# Patient Record
Sex: Female | Born: 1935 | Race: Black or African American | Hispanic: No | Smoking: Never smoker
Health system: Southern US, Community
[De-identification: ages and names within clinical notes are randomized; demographics above are authoritative.]

## PROBLEM LIST (undated history)

## (undated) DIAGNOSIS — S2239XA Fracture of one rib, unspecified side, initial encounter for closed fracture: Secondary | ICD-10-CM

## (undated) DIAGNOSIS — I441 Atrioventricular block, second degree: Secondary | ICD-10-CM

## (undated) DIAGNOSIS — I509 Heart failure, unspecified: Secondary | ICD-10-CM

## (undated) DIAGNOSIS — E7800 Pure hypercholesterolemia, unspecified: Secondary | ICD-10-CM

## (undated) DIAGNOSIS — M1990 Unspecified osteoarthritis, unspecified site: Secondary | ICD-10-CM

## (undated) DIAGNOSIS — S8290XD Unspecified fracture of unspecified lower leg, subsequent encounter for closed fracture with routine healing: Secondary | ICD-10-CM

## (undated) DIAGNOSIS — M4800 Spinal stenosis, site unspecified: Secondary | ICD-10-CM

## (undated) HISTORY — PX: HERNIA REPAIR: SHX51

## (undated) HISTORY — PX: ORTHOPEDIC SURGERY: SHX850

---

## 2009-06-14 DIAGNOSIS — F0280 Dementia in other diseases classified elsewhere without behavioral disturbance: Secondary | ICD-10-CM | POA: Insufficient documentation

## 2009-06-14 DIAGNOSIS — M810 Age-related osteoporosis without current pathological fracture: Secondary | ICD-10-CM | POA: Insufficient documentation

## 2009-09-20 DIAGNOSIS — M259 Joint disorder, unspecified: Secondary | ICD-10-CM | POA: Insufficient documentation

## 2009-09-20 DIAGNOSIS — H6120 Impacted cerumen, unspecified ear: Secondary | ICD-10-CM | POA: Insufficient documentation

## 2009-11-16 DIAGNOSIS — R634 Abnormal weight loss: Secondary | ICD-10-CM | POA: Insufficient documentation

## 2009-12-11 ENCOUNTER — Encounter: Admission: RE | Admit: 2009-12-11 | Discharge: 2009-12-11 | Payer: Self-pay | Admitting: Internal Medicine

## 2009-12-27 DIAGNOSIS — E875 Hyperkalemia: Secondary | ICD-10-CM | POA: Insufficient documentation

## 2010-02-20 ENCOUNTER — Encounter: Admission: RE | Admit: 2010-02-20 | Discharge: 2010-02-20 | Payer: Self-pay | Admitting: Internal Medicine

## 2010-05-08 DIAGNOSIS — M542 Cervicalgia: Secondary | ICD-10-CM | POA: Insufficient documentation

## 2010-05-10 ENCOUNTER — Inpatient Hospital Stay (HOSPITAL_COMMUNITY): Admission: EM | Admit: 2010-05-10 | Discharge: 2010-05-12 | Payer: Self-pay | Admitting: Emergency Medicine

## 2010-05-16 ENCOUNTER — Emergency Department (HOSPITAL_COMMUNITY): Admission: EM | Admit: 2010-05-16 | Discharge: 2010-05-16 | Payer: Self-pay | Admitting: Emergency Medicine

## 2010-05-28 ENCOUNTER — Emergency Department (HOSPITAL_COMMUNITY): Admission: EM | Admit: 2010-05-28 | Discharge: 2010-05-28 | Payer: Self-pay | Admitting: Emergency Medicine

## 2010-07-21 DIAGNOSIS — R269 Unspecified abnormalities of gait and mobility: Secondary | ICD-10-CM | POA: Insufficient documentation

## 2011-01-19 ENCOUNTER — Encounter: Payer: Self-pay | Admitting: Internal Medicine

## 2011-01-27 DIAGNOSIS — D649 Anemia, unspecified: Secondary | ICD-10-CM | POA: Insufficient documentation

## 2011-03-03 DIAGNOSIS — M5000 Cervical disc disorder with myelopathy, unspecified cervical region: Secondary | ICD-10-CM | POA: Insufficient documentation

## 2011-03-10 DIAGNOSIS — G44209 Tension-type headache, unspecified, not intractable: Secondary | ICD-10-CM | POA: Insufficient documentation

## 2011-03-12 ENCOUNTER — Emergency Department (HOSPITAL_COMMUNITY): Payer: Medicare Other

## 2011-03-12 ENCOUNTER — Emergency Department (HOSPITAL_COMMUNITY)
Admission: EM | Admit: 2011-03-12 | Discharge: 2011-03-12 | Disposition: A | Discharge status: Home / Self Care | Payer: Medicare Other | Attending: Emergency Medicine | Admitting: Emergency Medicine

## 2011-03-12 DIAGNOSIS — R51 Headache: Secondary | ICD-10-CM | POA: Insufficient documentation

## 2011-03-12 DIAGNOSIS — F0390 Unspecified dementia without behavioral disturbance: Secondary | ICD-10-CM | POA: Insufficient documentation

## 2011-03-12 DIAGNOSIS — I6789 Other cerebrovascular disease: Secondary | ICD-10-CM | POA: Insufficient documentation

## 2011-03-12 DIAGNOSIS — Z8679 Personal history of other diseases of the circulatory system: Secondary | ICD-10-CM | POA: Insufficient documentation

## 2011-03-12 DIAGNOSIS — G319 Degenerative disease of nervous system, unspecified: Secondary | ICD-10-CM | POA: Insufficient documentation

## 2011-03-12 DIAGNOSIS — E785 Hyperlipidemia, unspecified: Secondary | ICD-10-CM | POA: Insufficient documentation

## 2011-03-12 DIAGNOSIS — M542 Cervicalgia: Secondary | ICD-10-CM | POA: Insufficient documentation

## 2011-03-12 DIAGNOSIS — K219 Gastro-esophageal reflux disease without esophagitis: Secondary | ICD-10-CM | POA: Insufficient documentation

## 2011-03-12 DIAGNOSIS — M47812 Spondylosis without myelopathy or radiculopathy, cervical region: Secondary | ICD-10-CM | POA: Insufficient documentation

## 2011-03-17 LAB — URINALYSIS, ROUTINE W REFLEX MICROSCOPIC: Specific Gravity, Urine: 1.022 (ref 1.005–1.030)

## 2011-03-17 LAB — BASIC METABOLIC PANEL
BUN: 18 mg/dL (ref 6–23)
CO2: 25 mEq/L (ref 19–32)
Chloride: 107 mEq/L (ref 96–112)
GFR calc Af Amer: >60 mL/min (ref >60)
GFR calc non Af Amer: 50 mL/min — ABNORMAL LOW (ref >60)
Sodium: 141 mEq/L (ref 135–145)

## 2011-03-17 LAB — CBC
HCT: 36.1 % (ref 36.0–46.0)
Hemoglobin: 11.6 g/dL — ABNORMAL LOW (ref 12.0–15.0)
MCV: 88.3 fL (ref 78.0–100.0)
Platelets: 219 10*3/uL (ref 150–400)
WBC: 6.5 10*3/uL (ref 4.0–10.5)

## 2011-03-18 LAB — CBC
HCT: 35.1 % — ABNORMAL LOW (ref 36.0–46.0)
HCT: 36.3 % (ref 36.0–46.0)
Hemoglobin: 11.8 g/dL — ABNORMAL LOW (ref 12.0–15.0)
Hemoglobin: 12.2 g/dL (ref 12.0–15.0)
MCHC: 33.6 g/dL (ref 30.0–36.0)
MCV: 86.7 fL (ref 78.0–100.0)
MCV: 87 fL (ref 78.0–100.0)
RBC: 4.17 MIL/uL (ref 3.87–5.11)
RDW: 13.6 % (ref 11.5–15.5)
WBC: 4.4 10*3/uL (ref 4.0–10.5)

## 2011-03-18 LAB — CARDIAC PANEL(CRET KIN+CKTOT+MB+TROPI)
CK, MB: 1 ng/mL (ref 0.3–4.0)
CK, MB: 1.2 ng/mL (ref 0.3–4.0)
Relative Index: INVALID (ref 0.0–2.5)
Total CK: 85 U/L (ref 7–177)
Troponin I: <0.01 ng/mL (ref 0.00–0.06)

## 2011-03-18 LAB — COMPREHENSIVE METABOLIC PANEL
AST: 28 U/L (ref 0–37)
BUN: 17 mg/dL (ref 6–23)
BUN: 20 mg/dL (ref 6–23)
CO2: 28 mEq/L (ref 19–32)
Calcium: 8.7 mg/dL (ref 8.4–10.5)
Chloride: 110 mEq/L (ref 96–112)
Creatinine, Ser: 1.02 mg/dL (ref 0.4–1.2)
Creatinine, Ser: 1.02 mg/dL (ref 0.4–1.2)
GFR calc non Af Amer: 52 mL/min — ABNORMAL LOW (ref >60)
GFR calc non Af Amer: 52 mL/min — ABNORMAL LOW (ref >60)
Glucose, Bld: 94 mg/dL (ref 70–99)
Total Bilirubin: 0.4 mg/dL (ref 0.3–1.2)
Total Protein: 6.3 g/dL (ref 6.0–8.3)

## 2011-03-18 LAB — URINALYSIS, ROUTINE W REFLEX MICROSCOPIC
Hgb urine dipstick: NEGATIVE
Ketones, ur: NEGATIVE mg/dL
Nitrite: NEGATIVE
Protein, ur: NEGATIVE mg/dL
Specific Gravity, Urine: 1.022 (ref 1.005–1.030)
Urobilinogen, UA: 1 mg/dL (ref 0.0–1.0)
pH: 8.5 — ABNORMAL HIGH (ref 5.0–8.0)

## 2011-03-18 LAB — DIFFERENTIAL
Basophils Absolute: 0 10*3/uL (ref 0.0–0.1)
Eosinophils Relative: 2 % (ref 0–5)
Lymphocytes Relative: 35 % (ref 12–46)
Neutro Abs: 2.4 10*3/uL (ref 1.7–7.7)

## 2011-03-18 LAB — TROPONIN I: Troponin I: <0.01 ng/mL (ref 0.00–0.06)

## 2011-03-18 LAB — LIPID PANEL
LDL Cholesterol: 56 mg/dL (ref 0–99)
Triglycerides: 54 mg/dL (ref <150)

## 2011-03-18 LAB — POCT CARDIAC MARKERS: Troponin i, poc: <0.05 ng/mL (ref 0.00–0.09)

## 2011-03-18 LAB — HEMOGLOBIN A1C
Hgb A1c MFr Bld: 6.5 % — ABNORMAL HIGH (ref <5.7)
Mean Plasma Glucose: 140 mg/dL — ABNORMAL HIGH (ref <117)

## 2011-05-01 DIAGNOSIS — M6281 Muscle weakness (generalized): Secondary | ICD-10-CM

## 2011-05-01 DIAGNOSIS — R4182 Altered mental status, unspecified: Secondary | ICD-10-CM

## 2011-05-01 DIAGNOSIS — M1990 Unspecified osteoarthritis, unspecified site: Secondary | ICD-10-CM

## 2011-05-01 DIAGNOSIS — M5000 Cervical disc disorder with myelopathy, unspecified cervical region: Secondary | ICD-10-CM

## 2011-05-01 DIAGNOSIS — H543 Unqualified visual loss, both eyes: Secondary | ICD-10-CM

## 2011-05-01 DIAGNOSIS — M25519 Pain in unspecified shoulder: Secondary | ICD-10-CM

## 2011-05-01 DIAGNOSIS — M542 Cervicalgia: Secondary | ICD-10-CM

## 2011-05-01 DIAGNOSIS — R059 Cough, unspecified: Secondary | ICD-10-CM

## 2011-05-01 DIAGNOSIS — R0789 Other chest pain: Secondary | ICD-10-CM

## 2011-05-01 DIAGNOSIS — E785 Hyperlipidemia, unspecified: Secondary | ICD-10-CM

## 2011-05-01 DIAGNOSIS — R269 Unspecified abnormalities of gait and mobility: Secondary | ICD-10-CM

## 2011-05-01 DIAGNOSIS — R490 Dysphonia: Secondary | ICD-10-CM

## 2011-05-01 DIAGNOSIS — G8929 Other chronic pain: Secondary | ICD-10-CM

## 2011-05-01 DIAGNOSIS — H6120 Impacted cerumen, unspecified ear: Secondary | ICD-10-CM

## 2011-05-01 DIAGNOSIS — D649 Anemia, unspecified: Secondary | ICD-10-CM

## 2011-05-01 DIAGNOSIS — F0280 Dementia in other diseases classified elsewhere without behavioral disturbance: Secondary | ICD-10-CM

## 2011-05-01 DIAGNOSIS — R634 Abnormal weight loss: Secondary | ICD-10-CM

## 2011-05-01 DIAGNOSIS — E875 Hyperkalemia: Secondary | ICD-10-CM

## 2011-05-01 DIAGNOSIS — R05 Cough: Secondary | ICD-10-CM

## 2011-05-01 DIAGNOSIS — G4489 Other headache syndrome: Secondary | ICD-10-CM

## 2011-05-01 DIAGNOSIS — R609 Edema, unspecified: Secondary | ICD-10-CM

## 2011-05-01 DIAGNOSIS — M79609 Pain in unspecified limb: Secondary | ICD-10-CM

## 2011-05-01 DIAGNOSIS — M259 Joint disorder, unspecified: Secondary | ICD-10-CM

## 2011-05-01 DIAGNOSIS — M4800 Spinal stenosis, site unspecified: Secondary | ICD-10-CM

## 2011-05-01 DIAGNOSIS — R42 Dizziness and giddiness: Secondary | ICD-10-CM

## 2011-11-17 ENCOUNTER — Other Ambulatory Visit (HOSPITAL_BASED_OUTPATIENT_CLINIC_OR_DEPARTMENT_OTHER): Payer: Self-pay | Admitting: Internal Medicine

## 2011-11-17 DIAGNOSIS — Z780 Asymptomatic menopausal state: Secondary | ICD-10-CM

## 2011-11-17 DIAGNOSIS — Z1231 Encounter for screening mammogram for malignant neoplasm of breast: Secondary | ICD-10-CM

## 2011-12-16 ENCOUNTER — Ambulatory Visit: Payer: Medicare Other

## 2011-12-16 ENCOUNTER — Other Ambulatory Visit: Payer: Medicare Other

## 2012-02-04 ENCOUNTER — Encounter (HOSPITAL_COMMUNITY): Payer: Self-pay | Admitting: Emergency Medicine

## 2012-02-04 ENCOUNTER — Emergency Department (INDEPENDENT_AMBULATORY_CARE_PROVIDER_SITE_OTHER): Payer: Medicare Other

## 2012-02-04 ENCOUNTER — Emergency Department (INDEPENDENT_AMBULATORY_CARE_PROVIDER_SITE_OTHER)
Admission: EM | Admit: 2012-02-04 | Discharge: 2012-02-04 | Disposition: A | Discharge status: Home / Self Care | Payer: Medicare Other | Source: Home / Self Care | Attending: Emergency Medicine | Admitting: Emergency Medicine

## 2012-02-04 DIAGNOSIS — T148XXA Other injury of unspecified body region, initial encounter: Secondary | ICD-10-CM

## 2012-02-04 NOTE — ED Notes (Addendum)
Per daughter, pt fell Friday, and landed on bottom. Pain started a day or 2 later; reportedly had similar fall a few yr ago, and bruised tailbone . Patient c/o pain w walking or bending, points to perineal. Pelvic outlet area

## 2012-02-04 NOTE — ED Provider Notes (Signed)
Chief Complaint  Patient presents with  . Fall    History of Present Illness:  The patient is an 78 year old female. 6 days ago she was in the kitchen and wasn't using her walker or cane and she slipped or tripped and fell. She sat down, landing on her buttocks. Ever since then she's had pain over the ischial tuberosities and the cream and coccyx. There's been no bruising, swelling, or deformity. It's been getting slightly worse and hurts when she walks. She denies any numbness, tingling, or weakness in the lower extremities. No bladder or bowel complaints.  Review of Systems:  Other than noted above, the patient denies any of the following symptoms: Systemic:  No fever, chills, fatigue, or weight loss. GI:  No abdominal pain, nausea, vomiting, diarrhea, constipation or blood in stool. GU:  No dysuria, frequency, urgency, or hematuria. No incontinence or difficulty urinating.  M-S:  No neck pain, joint pain, arthritis, or myalgias. Neuro:  No parethesias or muscular weakness. Skin:  No rash or itching.   PMFSH:  Past medical history, family history, social history, meds, and allergies were reviewed.  Physical Exam:   Vital signs:  BP 128/61  Pulse 75  Temp(Src) 97.7 F (36.5 C) (Oral)  Resp 20  SpO2 95% General:  Alert, oriented, in no distress. Abdomen:  Soft, non-tender.  No organomegaly or mass.  No pulsatile midline abdominal mass or bruit. Back:  There was tenderness to palpation over the sacrum, coccyx, and ischial tuberosities. There was no swelling, bruising, or deformity. Neuro:  Normal muscle strength, sensations and DTRs. Skin:  Clear, warm and dry.  No rash.   Radiology:  Dg Hip Bilateral W/pelvis  02/04/2012  *RADIOLOGY REPORT*  Clinical Data: Larey Seat several nights ago with pain in the pelvis and both hips  BILATERAL HIP WITH PELVIS - 4+ VIEW  Comparison: None.  Findings: There are mild-to-moderate degenerative changes involving both hips left slightly greater than right.   No acute fracture is seen.  There is some irregularity of both inferior pubic and ischial rami most consistent with old fractures.  No acute fracture is seen.  The SI joints are unremarkable.  IMPRESSION: No acute fracture.  Old fractures of the inferior pubic rami.  Original Report Authenticated By: Juline Patch, M.D.    Medications given in U[facility]:  None  Assessment:   Diagnoses that have been ruled out:  None  Diagnoses that are still under consideration:  None  Final diagnoses:  Contusion    Plan:   1.  The following meds were prescribed:   New Prescriptions   No medications on file   She was told to use the tramadol or Percocet which she has at home, rest, use her walker when she's on her feet, use a soft pillow or donut for sitting, and apply ice. 2.  The patient was instructed in symptomatic care and handouts were given. 3.  The patient was told to return if becoming worse in any way, if no better in 3 or 4 days, and given some red flag symptoms that would indicate earlier return.    Roque Lias, MD 02/04/12 1340

## 2012-02-07 ENCOUNTER — Emergency Department (HOSPITAL_COMMUNITY)
Admission: EM | Admit: 2012-02-07 | Discharge: 2012-02-07 | Disposition: A | Discharge status: Home / Self Care | Payer: Medicare Other | Attending: Emergency Medicine | Admitting: Emergency Medicine

## 2012-02-07 ENCOUNTER — Encounter (HOSPITAL_COMMUNITY): Payer: Self-pay | Admitting: Emergency Medicine

## 2012-02-07 ENCOUNTER — Emergency Department (HOSPITAL_COMMUNITY): Payer: Medicare Other

## 2012-02-07 DIAGNOSIS — M545 Low back pain, unspecified: Secondary | ICD-10-CM | POA: Insufficient documentation

## 2012-02-07 DIAGNOSIS — E7800 Pure hypercholesterolemia, unspecified: Secondary | ICD-10-CM | POA: Insufficient documentation

## 2012-02-07 DIAGNOSIS — M4800 Spinal stenosis, site unspecified: Secondary | ICD-10-CM

## 2012-02-07 DIAGNOSIS — R269 Unspecified abnormalities of gait and mobility: Secondary | ICD-10-CM | POA: Insufficient documentation

## 2012-02-07 DIAGNOSIS — M810 Age-related osteoporosis without current pathological fracture: Secondary | ICD-10-CM | POA: Insufficient documentation

## 2012-02-07 DIAGNOSIS — M1990 Unspecified osteoarthritis, unspecified site: Secondary | ICD-10-CM | POA: Insufficient documentation

## 2012-02-07 DIAGNOSIS — Z79899 Other long term (current) drug therapy: Secondary | ICD-10-CM | POA: Insufficient documentation

## 2012-02-07 DIAGNOSIS — M549 Dorsalgia, unspecified: Secondary | ICD-10-CM

## 2012-02-07 HISTORY — DX: Pure hypercholesterolemia, unspecified: E78.00

## 2012-02-07 HISTORY — DX: Fracture of one rib, unspecified side, initial encounter for closed fracture: S22.39XA

## 2012-02-07 HISTORY — DX: Unspecified fracture of unspecified lower leg, subsequent encounter for closed fracture with routine healing: S82.90XD

## 2012-02-07 HISTORY — DX: Unspecified osteoarthritis, unspecified site: M19.90

## 2012-02-07 LAB — URINALYSIS, ROUTINE W REFLEX MICROSCOPIC
Bilirubin Urine: NEGATIVE
Glucose, UA: NEGATIVE mg/dL
Ketones, ur: NEGATIVE mg/dL
Leukocytes, UA: NEGATIVE
Specific Gravity, Urine: 1.017 (ref 1.005–1.030)
pH: 7.5 (ref 5.0–8.0)

## 2012-02-07 MED ORDER — HYDROMORPHONE HCL PF 1 MG/ML IJ SOLN
0.5000 mg | INTRAMUSCULAR | Status: AC
  Administered 2012-02-07: 0.5 mg via INTRAVENOUS
  Filled 2012-02-07: qty 1

## 2012-02-07 MED ORDER — OXYCODONE-ACETAMINOPHEN 5-325 MG PO TABS: 2.0000 | ORAL_TABLET | ORAL | Status: AC | PRN

## 2012-02-07 MED ORDER — HYDROMORPHONE HCL PF 1 MG/ML IJ SOLN
0.5000 mg | INTRAMUSCULAR | Status: AC
  Administered 2012-02-07: 0.5 mg via INTRAMUSCULAR
  Filled 2012-02-07: qty 1

## 2012-02-07 MED ORDER — METHYLPREDNISOLONE 4 MG PO KIT: 4.0000 mg | PACK | Freq: Once | ORAL | Status: DC

## 2012-02-07 NOTE — ED Notes (Signed)
Report received from Sherri, RN

## 2012-02-07 NOTE — ED Notes (Signed)
Pt fell 8 days ago at home - daughter states she "sat down in kitchen".  Has had increased pain since that time.  Seen at [facility] Wednesday for same and x-ray performed.  Dx with bruising and instructed to take pain med.  Continues to have pain, and now had pain in bilateral legs.

## 2012-02-07 NOTE — ED Notes (Signed)
Pt returned from xray at this time.

## 2012-02-07 NOTE — ED Notes (Signed)
Pt assisted on bedpan.

## 2012-02-07 NOTE — ED Provider Notes (Signed)
  I performed a history and physical examination of Mary Hartman and discussed her management with Dr. Milbert Coulter.  I agree with the history, physical, assessment, and plan of care, with the following exceptions: None  I was present for the following procedures: None Time Spent in Critical Care of the patient: None Time spent in discussions with the patient and family: 10 min  The patient with continued sacral and coccygeal pain after fall approximately 7 days ago. Was seen in urgent care where hip films were performed. Has been taking Percocet with continued pain. Is able to ambulate although with pain.  5/5 strength in the upper and lower ext bilaterally. She has normal reflexes. There is no loss of bowel or bladder function.  Possible compression deformities in the lumbar spine. MRI will be performed given the radiation to her legs bilaterally. This is unremarkable she can be discharged home with continued pain medication instructions followup with her primary care physician. She has been sitting on a donut pillow which has helped to extent.  Tildon Husky, MD 02/07/12 1314

## 2012-02-07 NOTE — ED Notes (Signed)
Regular meal tray ordered from service response center.

## 2012-02-07 NOTE — ED Notes (Signed)
Pt not back from MRI yet 

## 2012-02-07 NOTE — ED Notes (Signed)
Pt report given and care endorsed to Marylu Lund, [place].  Pt sent to MRI and will return to CDU 7

## 2012-02-07 NOTE — ED Notes (Signed)
Patient transported to X-ray 

## 2012-02-07 NOTE — ED Provider Notes (Signed)
History     CSN: [id number]  Arrival date & time 02/07/12  3086   First MD Initiated Contact with Patient 02/07/12 [phone]      Chief Complaint  Patient presents with  . Hip Pain  . Leg Pain   HPI Presents with b/l tail bone pain, pt is s/p mechanical fall on Friday (01/31/12) and she presented to urgent care.  Hip/pelvis XR at that time suggested no acute fracture.  Since Friday, pain has worsened, to the point that ambulation is limited.  No new trauma.   ROM limited by pain.  Pt able to perform transfers.  Denies numbness/tingling/dizziness & bowel/urinary incontinence.  Pain radiates to b/l legs.  She [place] unable to qualify pain, but describes as 10/10.  Pain medication (percocet 5-325 q4h) relieves pain somewhat.  History of bruising tail bone in past (about 7 years ago). History of rib fractures years ago.   Past Medical History  Diagnosis Date  . Osteoporosis   . Osteoarthritis   . Hypercholesteremia   . Orthopedic aftercare for healing traumatic leg fracture   . Rib fracture     Past Surgical History  Procedure Date  . Hernia repair   . Orthopedic surgery     No family history on file.   History  Substance Use Topics  . Smoking status: Never Smoker   . Smokeless tobacco: Not on file  . Alcohol Use: No    Review of Systems  Constitutional: Positive for activity change. Negative for fever, chills, diaphoresis and appetite change.  HENT: Negative for neck pain and neck stiffness.   Eyes: Negative for visual disturbance.  Respiratory: Negative for cough, choking, chest tightness, shortness of breath and wheezing.   Cardiovascular: Negative for chest pain, palpitations and leg swelling.  Gastrointestinal: Negative for nausea, vomiting, abdominal pain, diarrhea, constipation and abdominal distention.  Genitourinary: Negative for dysuria, urgency, frequency and difficulty urinating.  Musculoskeletal: Positive for arthralgias and gait problem. Negative for myalgias and joint  swelling.  Skin: Negative for rash and wound.  Neurological: Negative for dizziness, weakness, light-headedness, numbness and headaches.  Psychiatric/Behavioral: Negative for hallucinations, behavioral problems, confusion and agitation.    Allergies  Review of patient's allergies indicates no known allergies.  Home Medications   Current Outpatient Rx  Name Route Sig Dispense Refill  . ALENDRONATE SODIUM 70 MG PO TABS Oral Take 70 mg by mouth every 7 (seven) days. Sundays For osteoporosis. Take with a full glass of water on an empty stomach.    . ASPIRIN 81 MG PO TABS Oral Take 81 mg by mouth daily.      . ATORVASTATIN CALCIUM 20 MG PO TABS Oral Take 20 mg by mouth daily. For cholesterol     . DICLOFENAC SODIUM 1 % TD GEL Topical Apply topically as directed. 4 (four) grams 4 (four) times daily to back as needed for pain.     Marland Kitchen STOOL SOFTENER PO Oral Take by mouth daily as needed. For constipation     . FUROSEMIDE 40 MG PO TABS Oral Take 20 mg by mouth daily.     Marland Kitchen OMEPRAZOLE 20 MG PO CPDR Oral Take 20 mg by mouth daily. For reflux     . OXYCODONE-ACETAMINOPHEN 5-325 MG PO TABS Oral Take 1 tablet by mouth every 4 (four) hours as needed. For severe pain     . POTASSIUM BICARBONATE 25 MEQ PO TBEF Oral Take by mouth. Dissolved in water daily.     . TRAMADOL HCL 50 MG  PO TABS Oral Take 50 mg by mouth every 4 (four) hours as needed. for pain      BP 126/68  Pulse 89  Temp(Src) 98.2 F (36.8 C) (Oral)  Resp 18  Ht 4\' 9"  (1.448 m)  Wt 104 lb (47.174 kg)  BMI 22.51 kg/m2  SpO2 98%  Physical Exam  Vitals reviewed. Constitutional: She is oriented to person, place, and time. She appears well-developed and well-nourished. No distress.  Eyes: Conjunctivae and EOM are normal. Pupils are equal, round, and reactive to light.  Neck: Normal range of motion. Neck supple.  Cardiovascular: Normal rate, regular rhythm and intact distal pulses.   Murmur heard. Pulmonary/Chest: Effort normal and  breath sounds normal. No respiratory distress. She has no wheezes. She has no rales. She exhibits no tenderness.  Abdominal: Soft. Bowel sounds are normal. She exhibits no distension and no mass. There is no tenderness. There is no rebound and no guarding.  Musculoskeletal:       Arthritic changes in b/l hands; TTP of b/l coccyx & thighs; mild 1+ b/l pedal edema (pitting); ROM of b/l hip limited by pain of sitting on coccyx  Neurological: She is alert and oriented to person, place, and time. No cranial nerve deficit.  Skin: Skin is warm and dry. No rash noted. She is not diaphoretic.  Psychiatric: She has a normal mood and affect. Her behavior is normal.    ED Course  Procedures (including critical care time)   Labs Reviewed  URINALYSIS, ROUTINE W REFLEX MICROSCOPIC   Dg Lumbar Spine Complete  02/07/2012  *RADIOLOGY REPORT*  Clinical Data: Hip pain, leg pain post fall 1 week ago  LUMBAR SPINE - COMPLETE 4+ VIEW  Comparison:  lateral view of the chest 05/10/2010  Findings: Study is markedly limited by abundant bowel gas and significant osteopenia.  There is mild lumbar dextroscoliosis. There is moderate compression fracture of the L4 vertebral body of indeterminate age.  Subacute compression fracture cannot be excluded.  Clinical correlation is necessary.  There is about 1 cm anterolisthesis L3 on L4 vertebral body.  Further evaluation with CT scan or MRI is recommended. Significant disc space flattening with endplate sclerotic changes noted L5 S1 level.   IMPRESSION:  1.  Limited study by diffuse osteopenia and abundant bowel gas. There is moderate compression fracture of the L4 vertebral body of indeterminate age.  Subacute compression fracture cannot be excluded.  Clinical correlation is necessary.  There is about 1 cm anterolisthesis L3 on L4 vertebral body.  Further evaluation with CT scan or MRI is recommended.  Original Report Authenticated By: Natasha Mead, M.D.   Dg Sacrum/coccyx  02/07/2012   *RADIOLOGY REPORT*  Clinical Data: Lower back/tail bone pain  SACRUM AND COCCYX - 2+ VIEW  Comparison: None.  Findings: No displaced sacrococcygeal fracture is seen.  Degenerative changes of the lower lumbar spine.  Osteopenia.  IMPRESSION: No displaced sacrococcygeal fracture is seen.  Original Report Authenticated By: Charline Bills, M.D.     No diagnosis found.    MDM  Likely related to bruising of coccyx but in setting of worsening pain and radiation to legs, will order lumbar and sacrum/coccyx XR.  Will also order UA.  Pain mgmt with IM dilaudid.   UA negative.  Will follow up lumbar XR with lumbar MRI.  Pt to be moved to CDU for monitoring.        Vernice Jefferson, MD 02/07/12 1301

## 2012-02-27 ENCOUNTER — Encounter (HOSPITAL_COMMUNITY): Payer: Self-pay | Admitting: Emergency Medicine

## 2012-02-27 ENCOUNTER — Emergency Department (HOSPITAL_COMMUNITY): Payer: Medicare Other

## 2012-02-27 ENCOUNTER — Inpatient Hospital Stay (HOSPITAL_COMMUNITY)
Admission: EM | Admit: 2012-02-27 | Discharge: 2012-03-02 | DRG: 291 | Disposition: A | Discharge status: Skilled Nursing Facility | Payer: Medicare Other | Source: Ambulatory Visit | Attending: Internal Medicine | Admitting: Internal Medicine

## 2012-02-27 ENCOUNTER — Other Ambulatory Visit: Payer: Self-pay

## 2012-02-27 DIAGNOSIS — Z7982 Long term (current) use of aspirin: Secondary | ICD-10-CM

## 2012-02-27 DIAGNOSIS — E7800 Pure hypercholesterolemia, unspecified: Secondary | ICD-10-CM | POA: Diagnosis present

## 2012-02-27 DIAGNOSIS — E43 Unspecified severe protein-calorie malnutrition: Secondary | ICD-10-CM | POA: Diagnosis present

## 2012-02-27 DIAGNOSIS — F0280 Dementia in other diseases classified elsewhere without behavioral disturbance: Secondary | ICD-10-CM | POA: Diagnosis present

## 2012-02-27 DIAGNOSIS — R0602 Shortness of breath: Secondary | ICD-10-CM | POA: Diagnosis present

## 2012-02-27 DIAGNOSIS — G309 Alzheimer's disease, unspecified: Secondary | ICD-10-CM | POA: Diagnosis present

## 2012-02-27 DIAGNOSIS — I509 Heart failure, unspecified: Secondary | ICD-10-CM | POA: Diagnosis present

## 2012-02-27 DIAGNOSIS — Z23 Encounter for immunization: Secondary | ICD-10-CM

## 2012-02-27 DIAGNOSIS — M810 Age-related osteoporosis without current pathological fracture: Secondary | ICD-10-CM | POA: Diagnosis present

## 2012-02-27 DIAGNOSIS — Z79899 Other long term (current) drug therapy: Secondary | ICD-10-CM

## 2012-02-27 DIAGNOSIS — I5033 Acute on chronic diastolic (congestive) heart failure: Principal | ICD-10-CM | POA: Diagnosis present

## 2012-02-27 DIAGNOSIS — D649 Anemia, unspecified: Secondary | ICD-10-CM | POA: Diagnosis present

## 2012-02-27 HISTORY — DX: Spinal stenosis, site unspecified: M48.00

## 2012-02-27 LAB — CBC
HCT: 35 % — ABNORMAL LOW (ref 36.0–46.0)
Hemoglobin: 11.2 g/dL — ABNORMAL LOW (ref 12.0–15.0)
MCV: 86.2 fL (ref 78.0–100.0)
Platelets: 184 10*3/uL (ref 150–400)
RBC: 4.06 MIL/uL (ref 3.87–5.11)
WBC: 10.3 10*3/uL (ref 4.0–10.5)

## 2012-02-27 LAB — COMPREHENSIVE METABOLIC PANEL
ALT: 14 U/L (ref 0–35)
Alkaline Phosphatase: 145 U/L — ABNORMAL HIGH (ref 39–117)
CO2: 22 mEq/L (ref 19–32)
Calcium: 9.1 mg/dL (ref 8.4–10.5)
GFR calc Af Amer: 53 mL/min — ABNORMAL LOW (ref >90)
GFR calc non Af Amer: 46 mL/min — ABNORMAL LOW (ref >90)
Glucose, Bld: 140 mg/dL — ABNORMAL HIGH (ref 70–99)
Sodium: 138 mEq/L (ref 135–145)

## 2012-02-27 LAB — DIFFERENTIAL
Eosinophils Relative: 0 % (ref 0–5)
Lymphocytes Relative: 7 % — ABNORMAL LOW (ref 12–46)
Lymphs Abs: 0.7 10*3/uL (ref 0.7–4.0)
Monocytes Relative: 5 % (ref 3–12)

## 2012-02-27 MED ORDER — FUROSEMIDE 10 MG/ML IJ SOLN
40.0000 mg | Freq: Once | INTRAMUSCULAR | Status: AC
  Administered 2012-02-27: 40 mg via INTRAVENOUS
  Filled 2012-02-27: qty 4

## 2012-02-27 MED ORDER — NITROGLYCERIN 2 % TD OINT
0.5000 [in_us] | TOPICAL_OINTMENT | Freq: Once | TRANSDERMAL | Status: AC
  Administered 2012-02-27: 0.5 [in_us] via TOPICAL
  Filled 2012-02-27: qty 1

## 2012-02-27 NOTE — ED Notes (Signed)
Pt's daughter reports increased weakness, SOB, difficulty with ADLs and disoriented since last night, pt oriented to person and place in triage, O2 placed on pt in triage

## 2012-02-27 NOTE — ED Notes (Signed)
Portable chest x ray completed.

## 2012-02-27 NOTE — ED Notes (Signed)
Labs obtained per lab staff.

## 2012-02-28 ENCOUNTER — Encounter (HOSPITAL_COMMUNITY): Payer: Self-pay | Admitting: Nurse Practitioner

## 2012-02-28 DIAGNOSIS — R0602 Shortness of breath: Secondary | ICD-10-CM | POA: Diagnosis present

## 2012-02-28 DIAGNOSIS — E43 Unspecified severe protein-calorie malnutrition: Secondary | ICD-10-CM | POA: Diagnosis present

## 2012-02-28 DIAGNOSIS — D649 Anemia, unspecified: Secondary | ICD-10-CM | POA: Diagnosis present

## 2012-02-28 DIAGNOSIS — I509 Heart failure, unspecified: Secondary | ICD-10-CM | POA: Diagnosis present

## 2012-02-28 LAB — COMPREHENSIVE METABOLIC PANEL
ALT: 12 U/L (ref 0–35)
BUN: 22 mg/dL (ref 6–23)
CO2: 23 mEq/L (ref 19–32)
Calcium: 8.9 mg/dL (ref 8.4–10.5)
Creatinine, Ser: 1.16 mg/dL — ABNORMAL HIGH (ref 0.50–1.10)
GFR calc Af Amer: 50 mL/min — ABNORMAL LOW (ref >90)
GFR calc non Af Amer: 43 mL/min — ABNORMAL LOW (ref >90)
Glucose, Bld: 84 mg/dL (ref 70–99)
Total Protein: 7 g/dL (ref 6.0–8.3)

## 2012-02-28 LAB — CARDIAC PANEL(CRET KIN+CKTOT+MB+TROPI)
Relative Index: INVALID (ref 0.0–2.5)
Total CK: 80 U/L (ref 7–177)
Troponin I: <0.3 ng/mL (ref <0.30)

## 2012-02-28 MED ORDER — DICLOFENAC SODIUM 1 % TD GEL
1.0000 "application " | Freq: Four times a day (QID) | TRANSDERMAL | Status: DC | PRN
  Administered 2012-02-28 – 2012-03-02 (×2): 1 via TOPICAL
  Filled 2012-02-28: qty 100

## 2012-02-28 MED ORDER — ONDANSETRON HCL 4 MG PO TABS: 4.0000 mg | ORAL_TABLET | Freq: Four times a day (QID) | ORAL | Status: DC | PRN

## 2012-02-28 MED ORDER — DONEPEZIL HCL 5 MG PO TABS
5.0000 mg | ORAL_TABLET | Freq: Every day | ORAL | Status: DC
  Administered 2012-02-28 – 2012-03-02 (×4): 5 mg via ORAL
  Filled 2012-02-28 (×4): qty 1

## 2012-02-28 MED ORDER — SIMVASTATIN 40 MG PO TABS
40.0000 mg | ORAL_TABLET | Freq: Every day | ORAL | Status: DC
  Administered 2012-02-28 – 2012-03-02 (×4): 40 mg via ORAL
  Filled 2012-02-28 (×4): qty 1

## 2012-02-28 MED ORDER — DOCUSATE SODIUM 100 MG PO CAPS
100.0000 mg | ORAL_CAPSULE | Freq: Every day | ORAL | Status: DC
  Administered 2012-02-28 – 2012-03-02 (×4): 100 mg via ORAL
  Filled 2012-02-28 (×4): qty 1

## 2012-02-28 MED ORDER — FUROSEMIDE 10 MG/ML IJ SOLN
40.0000 mg | Freq: Every day | INTRAMUSCULAR | Status: DC
  Administered 2012-02-28 – 2012-02-29 (×2): 40 mg via INTRAVENOUS
  Filled 2012-02-28 (×2): qty 4

## 2012-02-28 MED ORDER — ONDANSETRON HCL 4 MG/2ML IJ SOLN: 4.0000 mg | Freq: Four times a day (QID) | INTRAMUSCULAR | Status: DC | PRN

## 2012-02-28 MED ORDER — ASPIRIN 81 MG PO CHEW
81.0000 mg | CHEWABLE_TABLET | Freq: Every day | ORAL | Status: DC
  Administered 2012-02-28 – 2012-03-02 (×4): 81 mg via ORAL
  Filled 2012-02-28 (×4): qty 1

## 2012-02-28 MED ORDER — PNEUMOCOCCAL VAC POLYVALENT 25 MCG/0.5ML IJ INJ
0.5000 mL | INJECTION | INTRAMUSCULAR | Status: AC
  Administered 2012-03-01: 0.5 mL via INTRAMUSCULAR
  Filled 2012-02-28: qty 0.5

## 2012-02-28 MED ORDER — ACETAMINOPHEN 325 MG PO TABS: 650.0000 mg | ORAL_TABLET | Freq: Four times a day (QID) | ORAL | Status: DC | PRN

## 2012-02-28 MED ORDER — ENSURE CLINICAL ST REVIGOR PO LIQD
237.0000 mL | Freq: Two times a day (BID) | ORAL | Status: DC
  Administered 2012-03-01 (×2): 237 mL via ORAL

## 2012-02-28 MED ORDER — PANTOPRAZOLE SODIUM 40 MG PO TBEC
40.0000 mg | DELAYED_RELEASE_TABLET | Freq: Every day | ORAL | Status: DC
  Administered 2012-02-28 – 2012-03-01 (×3): 40 mg via ORAL
  Filled 2012-02-28 (×2): qty 1

## 2012-02-28 MED ORDER — LIDOCAINE 5 % EX PTCH
1.0000 | MEDICATED_PATCH | CUTANEOUS | Status: DC
  Administered 2012-02-29 – 2012-03-02 (×2): 1 via TRANSDERMAL
  Filled 2012-02-28 (×5): qty 1

## 2012-02-28 MED ORDER — TRAMADOL HCL 50 MG PO TABS
50.0000 mg | ORAL_TABLET | ORAL | Status: DC | PRN
  Administered 2012-02-28 – 2012-03-01 (×5): 50 mg via ORAL
  Filled 2012-02-28 (×5): qty 1

## 2012-02-28 MED ORDER — ACETAMINOPHEN 650 MG RE SUPP: 650.0000 mg | Freq: Four times a day (QID) | RECTAL | Status: DC | PRN

## 2012-02-28 NOTE — Progress Notes (Signed)
Patient's daughter informed that patient is in a camera room.  She agreed that when she left for the night it was a good idea to use for the patient's safety.  Bed alarm also turned on and call bell within reach.

## 2012-02-28 NOTE — Progress Notes (Signed)
Subjective: Still get winded with moving around. Objective: Filed Vitals:   02/27/12 2330 02/28/12 0000 02/28/12 0047 02/28/12 0555  BP: 105/66 107/64 109/68 129/79  Pulse: 80 78 88 76  Temp:   98.1 F (36.7 C) 98.7 F (37.1 C)  TempSrc:   Oral Oral  Resp: 20 21 20    Height:   4\' 9"  (1.448 m)   Weight:   44.6 kg (98 lb 5.2 oz)   SpO2: 94% 96% 90% 93%   Weight change:   Intake/Output Summary (Last 24 hours) at 02/28/12 1306 Last data filed at 02/28/12 1152  Gross per 24 hour  Intake    360 ml  Output   1475 ml  Net  -1115 ml    General: Alert, awake, oriented x1, in no acute distress.  HEENT: No bruits, no goiter. +JVD. Heart: Regular rate and rhythm, without murmurs, rubs, gallops.  Lungs: Crackles bilateral crackles,  bilateral air movement.  Abdomen: Soft, nontender, nondistended, positive bowel sounds.  Neuro: Grossly intact, nonfocal.   Lab Results:  Basename 02/28/12 0600 02/27/12 2110  NA 140 138  K 4.0 4.3  CL 104 103  CO2 23 22  GLUCOSE 84 140*  BUN 22 23  CREATININE 1.16* 1.10  CALCIUM 8.9 9.1  MG -- --  PHOS -- --    Basename 02/28/12 0600 02/27/12 2110  AST 20 20  ALT 12 14  ALKPHOS 137* 145*  BILITOT 0.5 0.5  PROT 7.0 7.4  ALBUMIN 2.7* 2.9*   No results found for this basename: LIPASE:2,AMYLASE:2 in the last 72 hours  Basename 02/27/12 2110  WBC 10.3  NEUTROABS 9.1*  HGB 11.2*  HCT 35.0*  MCV 86.2  PLT 184    Basename 02/28/12 0056 02/27/12 1912  CKTOTAL 80 --  CKMB 2.0 --  CKMBINDEX -- --  TROPONINI <0.30 <0.30   No components found with this basename: POCBNP:3 No results found for this basename: DDIMER:2 in the last 72 hours No results found for this basename: HGBA1C:2 in the last 72 hours No results found for this basename: CHOL:2,HDL:2,LDLCALC:2,TRIG:2,CHOLHDL:2,LDLDIRECT:2 in the last 72 hours No results found for this basename: TSH,T4TOTAL,FREET3,T3FREE,THYROIDAB in the last 72 hours No results found for this basename:  VITAMINB12:2,FOLATE:2,FERRITIN:2,TIBC:2,IRON:2,RETICCTPCT:2 in the last 72 hours  Micro Results: No results found for this or any previous visit (from the past 240 hour(s)).  Studies/Results: Dg Chest Portable 1 View  02/27/2012  *RADIOLOGY REPORT*  Clinical Data: Weakness and shortness of breath.  PORTABLE CHEST - 1 VIEW  Comparison: 05/10/2010  Findings: The heart is enlarged.  There are interstitial changes of pulmonary edema.  No focal consolidations are identified. Bilateral pleural effusions are suspected.  IMPRESSION: Cardiomegaly and interstitial edema.  Original Report Authenticated By: Patterson Hammersmith, M.D.    Medications: I have reviewed the patient's current medications.  Assessment and plan: Principal Problem:  *CHF, acute on chronic -continue lasix strict I and O's echo on 2011 showed diastolic heart failure. Cardiac markers continue to be negative. -She has crackles at bases and has + JVD -continue fluids restriction but liberalize slightly.    Alzheimer's disease Stable, continue home meds.  3.Protein mal nutrition: Nutrition consult.  4.Normocytic anemia: -Follow up with PCP, no history of melena. -PT consult. -SW consult for placement.      LOS: 1 day   Marinda Elk M.D. Pager: [phone] [facility] 02/28/2012, 1:06 PM

## 2012-02-28 NOTE — Progress Notes (Signed)
INITIAL ADULT NUTRITION ASSESSMENT Date: 02/28/2012   Time: 2:31 PM  Reason for Assessment: Consult (patient appears severely malnourished)  ASSESSMENT: Female 78 y.o.  Dx: CHF, acute on chronic  Hx:  Past Medical History  Diagnosis Date  . Osteoporosis   . Osteoarthritis   . Hypercholesteremia   . Orthopedic aftercare for healing traumatic leg fracture   . Rib fracture   . Osteoarthritis   . Spinal stenosis   . DEMENTIA    Related Meds:  Scheduled Meds:   . aspirin  81 mg Oral Daily  . docusate sodium  100 mg Oral Daily  . donepezil  5 mg Oral Daily  . furosemide  40 mg Intravenous Once  . furosemide  40 mg Intravenous Daily  . lidocaine  1 patch Transdermal Q24H  . nitroGLYCERIN  0.5 inch Topical Once  . pantoprazole  40 mg Oral Q1200  . pneumococcal 23 valent vaccine  0.5 mL Intramuscular Tomorrow-1000  . simvastatin  40 mg Oral Daily   Continuous Infusions:  PRN Meds:.acetaminophen, acetaminophen, diclofenac sodium, ondansetron (ZOFRAN) IV, ondansetron, traMADol   Ht: 4\' 9"  (144.8 cm) (Stated)  Wt: 98 lb 5.2 oz (44.6 kg) (Bed Scale)  Ideal Wt: 43.2 kg % Ideal Wt: 103%  Wt Readings from Last 12 Encounters:  02/28/12 98 lb 5.2 oz (44.6 kg)  02/07/12 104 lb (47.174 kg)   Usual Wt: 119 lb per patient; 104 lb 4 weeks ago % Usual Wt: 6% weight loss in one month  Body mass index is 21.28 kg/(m^2). Normal weight  Food/Nutrition Related Hx: Patient says she drinks Ensure.  Labs:  CMP     Component Value Date/Time   NA 140 02/28/2012 0600   K 4.0 02/28/2012 0600   CL 104 02/28/2012 0600   CO2 23 02/28/2012 0600   GLUCOSE 84 02/28/2012 0600   BUN 22 02/28/2012 0600   CREATININE 1.16* 02/28/2012 0600   CALCIUM 8.9 02/28/2012 0600   PROT 7.0 02/28/2012 0600   ALBUMIN 2.7* 02/28/2012 0600   AST 20 02/28/2012 0600   ALT 12 02/28/2012 0600   ALKPHOS 137* 02/28/2012 0600   BILITOT 0.5 02/28/2012 0600   GFRNONAA 43* 02/28/2012 0600   GFRAA 50* 02/28/2012 0600    Intake/Output Summary  (Last 24 hours) at 02/28/12 1434 Last data filed at 02/28/12 1152  Gross per 24 hour  Intake    360 ml  Output   1475 ml  Net  -1115 ml    Diet Order: Heart Healthy   IVF:    Estimated Nutritional Needs:   Kcal: 1100-1300 Protein: 55-65 grams Fluid: 1.1-1.3 liters  Patient is at nutrition risk with recent weight loss and poor intake of meals.  Suspect weight fluctuates with fluid status.  Suspect intake PTA was suboptimal.  NUTRITION DIAGNOSIS: -Inadequate oral intake (NI-2.1).  Status: Ongoing  RELATED TO: poor appetite with SOB  AS EVIDENCED BY: 25% meal completion and 6% weight loss in one month  MONITORING/EVALUATION(Goals): Intake to meet >90% of estimated needs for weight maintenance.  EDUCATION NEEDS: -Education not appropriate at this time  INTERVENTION: Ensure Clinical Strength BID to maximize oral intake.  Dietitian #:  [phone]  DOCUMENTATION CODES Per approved criteria  -Not Applicable    Mary Hartman 02/28/2012, 2:31 PM

## 2012-02-28 NOTE — H&P (Addendum)
PCP:  Terald Sleeper, MD, MD   DOA:  02/27/2012  6:39 PM  Chief Complaint:  Shortness of breath  HPI: 78 years old African American woman with history of dementia, diastolic congestive heart failure. Presented to the ER today with shortness of breath started on Tuesday and progressively worsening. Patient denies any chest pain, palpitation, cough. She denies any fever of chills. In the ED chest x-ray showed interstitial edema and proBNP was elevated. This was given Lasix IV and we're asked to admit for further management.  Apparently patient was seen recently in the ER after she fell MRI lumbar spine showed severe spinal stenosis and the DJD with no fractures.Xrays showed No displaced sacrococcygeal fracture.   Allergies: No Known Allergies  Prior to Admission medications   Medication Sig Start Date End Date Taking? Authorizing Provider  alendronate (FOSAMAX) 70 MG tablet Take 70 mg by mouth every 7 (seven) days. Sundays For osteoporosis. Take with a full glass of water on an empty stomach.   Yes Historical Provider, MD  aspirin 81 MG tablet Take 81 mg by mouth daily.     Yes Historical Provider, MD  atorvastatin (LIPITOR) 20 MG tablet Take 20 mg by mouth daily. For cholesterol    Yes Historical Provider, MD  diclofenac sodium (VOLTAREN) 1 % GEL Apply topically as directed. 4 (four) grams 4 (four) times daily to back as needed for pain.    Yes Historical Provider, MD  Docusate Calcium (STOOL SOFTENER PO) Take 1 capsule by mouth daily as needed. For constipation   Yes Historical Provider, MD  donepezil (ARICEPT) 5 MG tablet Take 5 mg by mouth daily.   Yes Historical Provider, MD  furosemide (LASIX) 40 MG tablet Take 20 mg by mouth daily.    Yes Historical Provider, MD  lidocaine (LIDODERM) 5 % Place 1 patch onto the skin daily. Remove & Discard patch within 12 hours or as directed by MD   Yes Historical Provider, MD  omeprazole (PRILOSEC) 20 MG capsule Take 20 mg by mouth daily. For  reflux    Yes Historical Provider, MD  potassium bicarbonate (K-EFFERVESCENT) 25 MEQ disintegrating tablet Take 25 mEq by mouth daily. Dissolved in water daily. Take with lipitor   Yes Historical Provider, MD  traMADol (ULTRAM) 50 MG tablet Take 50 mg by mouth every 4 (four) hours as needed. for pain   Yes Historical Provider, MD  methylPREDNISolone (MEDROL DOSEPAK) 4 MG tablet Take 1 tablet (4 mg total) by mouth once. 5 tables po daily for Days 1-3, 4 tablets po daily for days 4-6, 3 tablets po daily days 7-9, 2 tablets po daily 10-12 02/07/12   Madelaine Bhat, PA    Past Medical History  Diagnosis Date  . Osteoporosis   . Osteoarthritis   . Hypercholesteremia   . Orthopedic aftercare for healing traumatic leg fracture   . Rib fracture     Past Surgical History  Procedure Date  . Hernia repair   . Orthopedic surgery     Social History: Lives with daughter, denies smoking, EtOH or illicit drug [place]   Review of Systems:  Constitutional: Denies fever, chills, diaphoresis, appetite change and fatigue.  HEENT: Denies photophobia, eye pain, redness, hearing loss, ear pain, congestion, sore throat, rhinorrhea, sneezing, mouth sores, trouble swallowing, neck pain, neck stiffness and tinnitus.   Respiratory: c/oSOB, DOE,  Denies cough, chest tightness,  and wheezing.   Cardiovascular: Denies chest pain, palpitations and leg swelling.  Gastrointestinal: Denies nausea, vomiting, abdominal pain,  diarrhea, constipation, blood in stool and abdominal distention.  Genitourinary: Denies dysuria, urgency, frequency, hematuria, flank pain and difficulty urinating.  Neurological: Denies dizziness, seizures, syncope, weakness, light-headedness, numbness and headaches.  Hematological: Denies adenopathy. Easy bruising, personal or family bleeding history  Psychiatric/Behavioral: Denies suicidal ideation, mood changes, confusion, nervousness, sleep disturbance and agitation   Physical Exam:  Filed  Vitals:   02/27/12 2200 02/27/12 2300 02/27/12 2330 02/28/12 0000  BP: 114/70 105/65 105/66 107/64  Pulse: 77 76 80 78  Temp:      Resp: 23 24 20 21   SpO2: 96% 93% 94% 96%    Constitutional: Vital signs reviewed.  Patient is in no acute distress and cooperative with exam. Alert and oriented x2  Eyes: PERRL, EOMI, conjunctivae normal, No scleral icterus.  Neck: Supple, Trachea midline normal ROM, No JVD, mass, thyromegaly, or carotid bruit present.  Cardiovascular: RRR, S1 normal, S2 normal, no MRG, pulses symmetric and intact bilaterally Pulmonary/Chest: Few bibasilar Rales  Abdominal: Soft. Non-tender, non-distended, bowel sounds are normal, no masses, organomegaly, or guarding present.  Ext: no edema and no cyanosis, pulses palpable bilaterally  Neurological: A&O x2, moves all extremities, no focal deficit appreciated.   Labs on Admission:  Results for orders placed during the hospital encounter of 02/27/12 (from the past 48 hour(s))  TROPONIN I     Status: Normal   Collection Time   02/27/12  7:12 PM      Component Value Range Comment   Troponin I <0.30  <0.30 (ng/mL)   CBC     Status: Abnormal   Collection Time   02/27/12  9:10 PM      Component Value Range Comment   WBC 10.3  4.0 - 10.5 (K/uL)    RBC 4.06  3.87 - 5.11 (MIL/uL)    Hemoglobin 11.2 (*) 12.0 - 15.0 (g/dL)    HCT 40.9 (*) 81.1 - 46.0 (%)    MCV 86.2  78.0 - 100.0 (fL)    MCH 27.6  26.0 - 34.0 (pg)    MCHC 32.0  30.0 - 36.0 (g/dL)    RDW 91.4  78.2 - 95.6 (%)    Platelets 184  150 - 400 (K/uL)   DIFFERENTIAL     Status: Abnormal   Collection Time   02/27/12  9:10 PM      Component Value Range Comment   Neutrophils Relative 88 (*) 43 - 77 (%)    Neutro Abs 9.1 (*) 1.7 - 7.7 (K/uL)    Lymphocytes Relative 7 (*) 12 - 46 (%)    Lymphs Abs 0.7  0.7 - 4.0 (K/uL)    Monocytes Relative 5  3 - 12 (%)    Monocytes Absolute 0.5  0.1 - 1.0 (K/uL)    Eosinophils Relative 0  0 - 5 (%)    Eosinophils Absolute 0.0  0.0 -  0.7 (K/uL)    Basophils Relative 0  0 - 1 (%)    Basophils Absolute 0.0  0.0 - 0.1 (K/uL)   COMPREHENSIVE METABOLIC PANEL     Status: Abnormal   Collection Time   02/27/12  9:10 PM      Component Value Range Comment   Sodium 138  135 - 145 (mEq/L)    Potassium 4.3  3.5 - 5.1 (mEq/L)    Chloride 103  96 - 112 (mEq/L)    CO2 22  19 - 32 (mEq/L)    Glucose, Bld 140 (*) 70 - 99 (mg/dL)    BUN 23  6 - 23 (mg/dL)    Creatinine, Ser 9.56  0.50 - 1.10 (mg/dL)    Calcium 9.1  8.4 - 10.5 (mg/dL)    Total Protein 7.4  6.0 - 8.3 (g/dL)    Albumin 2.9 (*) 3.5 - 5.2 (g/dL)    AST 20  0 - 37 (U/L)    ALT 14  0 - 35 (U/L)    Alkaline Phosphatase 145 (*) 39 - 117 (U/L)    Total Bilirubin 0.5  0.3 - 1.2 (mg/dL)    GFR calc non Af Amer 46 (*) >90 (mL/min)    GFR calc Af Amer 53 (*) >90 (mL/min)   PRO B NATRIURETIC PEPTIDE     Status: Abnormal   Collection Time   02/27/12  9:11 PM      Component Value Range Comment   Pro B Natriuretic peptide (BNP) 2213.0 (*) 0 - 450 (pg/mL)     Radiological Exams on Admission Dg Chest Portable 1 View  02/27/2012  *RADIOLOGY REPORT*  Clinical Data: Weakness and shortness of breath.  PORTABLE CHEST - 1 VIEW  Comparison: 05/10/2010  Findings: The heart is enlarged.  There are interstitial changes of pulmonary edema.  No focal consolidations are identified. Bilateral pleural effusions are suspected.  IMPRESSION: Cardiomegaly and interstitial edema.  Original Report Authenticated By: Patterson Hammersmith, M.D.   EKG: Sinus rhythm with right bundle branch block and nonspecific ST changes (changed from previous EKG)  Assessment/Plan Principal Problem:  *SOB (shortness of breath) Active Problems:  Alzheimer's disease  CHF, acute on chronic Recent fall Severe spinal stenosis and degenerative joint disease. No displaced sacrococcygeal fracture   plan  Admit to telemetry, continue to cycle cardiac enzymes.   IV Lasix, strict input and output and daily weight. Obtain 2-D  echocardiogram Given acute decompensation and hypotension will not start  beta blockers. Would hold off ACE inhibitor at this time given hypotension.consider if BP will allow . DVT and GI prophylaxis PT evaluation,pain control . Full code as per daughter.    Time Spent on Admission:  approximately 50 minutes.  Alaysha Jefcoat 02/28/2012, 12:23 AM

## 2012-02-28 NOTE — ED Provider Notes (Signed)
History     CSN: [id number]  Arrival date & time 02/27/12  1804   First MD Initiated Contact with Patient 02/27/12 1849      Chief Complaint  Patient presents with  . Weakness    (Consider location/radiation/quality/duration/timing/severity/associated sxs/prior treatment) Patient is a 78 y.o. female presenting with weakness. The history is provided by the patient (pt complains of weakness). No language interpreter was used.  Weakness Primary symptoms do not include headaches, seizures or dizziness. The symptoms began 3 to 5 days ago. The symptoms are unchanged. The neurological symptoms are diffuse. The symptoms occurred after standing up.  Additional symptoms include weakness. Additional symptoms do not include pain or hallucinations. Medical issues do not include seizures. Procedure history comments: none.    Past Medical History  Diagnosis Date  . Osteoporosis   . Osteoarthritis   . Hypercholesteremia   . Orthopedic aftercare for healing traumatic leg fracture   . Rib fracture     Past Surgical History  Procedure Date  . Hernia repair   . Orthopedic surgery     No family history on file.  History  Substance Use Topics  . Smoking status: Never Smoker   . Smokeless tobacco: Not on file  . Alcohol Use: No    OB History    Grav Para Term Preterm Abortions TAB SAB Ect Mult Living                  Review of Systems  Constitutional: Negative for fatigue.  HENT: Negative for congestion, sinus pressure and ear discharge.   Eyes: Negative for discharge.  Respiratory: Negative for cough.   Cardiovascular: Negative for chest pain.  Gastrointestinal: Negative for abdominal pain and diarrhea.  Genitourinary: Negative for frequency and hematuria.  Musculoskeletal: Negative for back pain.  Skin: Negative for rash.  Neurological: Positive for weakness. Negative for dizziness, seizures and headaches.  Hematological: Negative.   Psychiatric/Behavioral: Negative for  hallucinations.    Allergies  Review of patient's allergies indicates no known allergies.  Home Medications  No current outpatient prescriptions on file.  BP 107/64  Pulse 78  Temp 98.6 F (37 C)  Resp 21  SpO2 96%  Physical Exam  Constitutional: She is oriented to person, place, and time. She appears well-developed.  HENT:  Head: Normocephalic and atraumatic.  Eyes: Conjunctivae and EOM are normal. No scleral icterus.  Neck: Neck supple. No thyromegaly present.  Cardiovascular: Normal rate and regular rhythm.  Exam reveals no gallop and no friction rub.   No murmur heard. Pulmonary/Chest: No stridor. She has no wheezes. She has no rales. She exhibits no tenderness.  Abdominal: She exhibits no distension. There is no tenderness. There is no rebound.  Musculoskeletal: Normal range of motion. She exhibits no edema.  Lymphadenopathy:    She has no cervical adenopathy.  Neurological: She is oriented to person, place, and time. Coordination normal.  Skin: No rash noted. No erythema.  Psychiatric: She has a normal mood and affect. Her behavior is normal.    ED Course  Procedures (including critical care time)  Labs Reviewed  CBC - Abnormal; Notable for the following:    Hemoglobin 11.2 (*)    HCT 35.0 (*)    All other components within normal limits  DIFFERENTIAL - Abnormal; Notable for the following:    Neutrophils Relative 88 (*)    Neutro Abs 9.1 (*)    Lymphocytes Relative 7 (*)    All other components within normal limits  COMPREHENSIVE  METABOLIC PANEL - Abnormal; Notable for the following:    Glucose, Bld 140 (*)    Albumin 2.9 (*)    Alkaline Phosphatase 145 (*)    GFR calc non Af Amer 46 (*)    GFR calc Af Amer 53 (*)    All other components within normal limits  PRO B NATRIURETIC PEPTIDE - Abnormal; Notable for the following:    Pro B Natriuretic peptide (BNP) 2213.0 (*)    All other components within normal limits  TROPONIN I   Dg Chest Portable 1  View  02/27/2012  *RADIOLOGY REPORT*  Clinical Data: Weakness and shortness of breath.  PORTABLE CHEST - 1 VIEW  Comparison: 05/10/2010  Findings: The heart is enlarged.  There are interstitial changes of pulmonary edema.  No focal consolidations are identified. Bilateral pleural effusions are suspected.  IMPRESSION: Cardiomegaly and interstitial edema.  Original Report Authenticated By: Patterson Hammersmith, M.D.     1. CHF (congestive heart failure)      Date: 02/28/2012  Rate: 103  Rhythm: sinus tachycardia  QRS Axis: normal  Intervals: normal  ST/T Wave abnormalities: nonspecific T wave changes  Conduction Disutrbances:right bundle branch block  New right bundle  Narrative Interpretation:   Old EKG Reviewed: changes noted Results for orders placed during the hospital encounter of 02/27/12  CBC      Component Value Range   WBC 10.3  4.0 - 10.5 (K/uL)   RBC 4.06  3.87 - 5.11 (MIL/uL)   Hemoglobin 11.2 (*) 12.0 - 15.0 (g/dL)   HCT 44.3 (*) 15.4 - 46.0 (%)   MCV 86.2  78.0 - 100.0 (fL)   MCH 27.6  26.0 - 34.0 (pg)   MCHC 32.0  30.0 - 36.0 (g/dL)   RDW 00.8  67.6 - 19.5 (%)   Platelets 184  150 - 400 (K/uL)  DIFFERENTIAL      Component Value Range   Neutrophils Relative 88 (*) 43 - 77 (%)   Neutro Abs 9.1 (*) 1.7 - 7.7 (K/uL)   Lymphocytes Relative 7 (*) 12 - 46 (%)   Lymphs Abs 0.7  0.7 - 4.0 (K/uL)   Monocytes Relative 5  3 - 12 (%)   Monocytes Absolute 0.5  0.1 - 1.0 (K/uL)   Eosinophils Relative 0  0 - 5 (%)   Eosinophils Absolute 0.0  0.0 - 0.7 (K/uL)   Basophils Relative 0  0 - 1 (%)   Basophils Absolute 0.0  0.0 - 0.1 (K/uL)  COMPREHENSIVE METABOLIC PANEL      Component Value Range   Sodium 138  135 - 145 (mEq/L)   Potassium 4.3  3.5 - 5.1 (mEq/L)   Chloride 103  96 - 112 (mEq/L)   CO2 22  19 - 32 (mEq/L)   Glucose, Bld 140 (*) 70 - 99 (mg/dL)   BUN 23  6 - 23 (mg/dL)   Creatinine, Ser 0.93  0.50 - 1.10 (mg/dL)   Calcium 9.1  8.4 - 26.7 (mg/dL)   Total Protein  7.4  6.0 - 8.3 (g/dL)   Albumin 2.9 (*) 3.5 - 5.2 (g/dL)   AST 20  0 - 37 (U/L)   ALT 14  0 - 35 (U/L)   Alkaline Phosphatase 145 (*) 39 - 117 (U/L)   Total Bilirubin 0.5  0.3 - 1.2 (mg/dL)   GFR calc non Af Amer 46 (*) >90 (mL/min)   GFR calc Af Amer 53 (*) >90 (mL/min)  TROPONIN I  Component Value Range   Troponin I <0.30  <0.30 (ng/mL)  PRO B NATRIURETIC PEPTIDE      Component Value Range   Pro B Natriuretic peptide (BNP) 2213.0 (*) 0 - 450 (pg/mL)  COMPREHENSIVE METABOLIC PANEL      Component Value Range   Sodium 140  135 - 145 (mEq/L)   Potassium 4.0  3.5 - 5.1 (mEq/L)   Chloride 104  96 - 112 (mEq/L)   CO2 23  19 - 32 (mEq/L)   Glucose, Bld 84  70 - 99 (mg/dL)   BUN 22  6 - 23 (mg/dL)   Creatinine, Ser 4.54 (*) 0.50 - 1.10 (mg/dL)   Calcium 8.9  8.4 - 09.8 (mg/dL)   Total Protein 7.0  6.0 - 8.3 (g/dL)   Albumin 2.7 (*) 3.5 - 5.2 (g/dL)   AST 20  0 - 37 (U/L)   ALT 12  0 - 35 (U/L)   Alkaline Phosphatase 137 (*) 39 - 117 (U/L)   Total Bilirubin 0.5  0.3 - 1.2 (mg/dL)   GFR calc non Af Amer 43 (*) >90 (mL/min)   GFR calc Af Amer 50 (*) >90 (mL/min)  CARDIAC PANEL(CRET KIN+CKTOT+MB+TROPI)      Component Value Range   Total CK 80  7 - 177 (U/L)   CK, MB 2.0  0.3 - 4.0 (ng/mL)   Troponin I <0.30  <0.30 (ng/mL)   Relative Index RELATIVE INDEX IS INVALID  0.0 - 2.5     MDM  chf        Benny Lennert, MD 02/29/12 1148

## 2012-02-29 MED ORDER — METOPROLOL TARTRATE 12.5 MG HALF TABLET: 12.5000 mg | ORAL_TABLET | Freq: Two times a day (BID) | ORAL | Status: DC

## 2012-02-29 MED ORDER — FUROSEMIDE 20 MG PO TABS
20.0000 mg | ORAL_TABLET | Freq: Two times a day (BID) | ORAL | Status: DC
  Administered 2012-02-29 – 2012-03-01 (×2): 20 mg via ORAL
  Filled 2012-02-29 (×4): qty 1

## 2012-02-29 MED ORDER — OXYCODONE HCL 5 MG PO TABS: 5.0000 mg | ORAL_TABLET | ORAL | Status: DC | PRN

## 2012-02-29 MED ORDER — METOPROLOL TARTRATE 12.5 MG HALF TABLET
12.5000 mg | ORAL_TABLET | Freq: Two times a day (BID) | ORAL | Status: DC
  Administered 2012-02-29 – 2012-03-02 (×4): 12.5 mg via ORAL
  Filled 2012-02-29 (×6): qty 1

## 2012-02-29 MED ORDER — FUROSEMIDE 20 MG PO TABS: 20.0000 mg | ORAL_TABLET | Freq: Every day | ORAL | Status: DC

## 2012-02-29 NOTE — Progress Notes (Signed)
Physical Therapy Evaluation  Past Medical History  Diagnosis Date  . Osteoporosis   . Osteoarthritis   . Hypercholesteremia   . Orthopedic aftercare for healing traumatic leg fracture   . Rib fracture   . Osteoarthritis   . Spinal stenosis   . DEMENTIA    Past Surgical History  Procedure Date  . Hernia repair   . Orthopedic surgery     02/29/12 1200  PT Visit Information  Last PT Received On 02/29/12  Patient Stated Goals  Goal #1 Dtr wishes   Precautions  Precautions Fall  Restrictions  Weight Bearing Restrictions No  Home Living  Lives With Daughter;Family  Receives Help From Family;Personal care attendant (CG in am, Son-in-law in pm while Dtr at work.  )  Type of Home House  Home Layout Two level;Bed/bath upstairs;1/2 bath on main level  Home Access Stairs to enter  Home Adaptive Equipment Walker - four wheeled;Shower chair with back  Additional Comments Dtr notes pt needs to be able to do stairs to return home as bed and full bath are upstairs, so plan is for ST-SNF at this time.    Prior Function  Level of Independence Independent with gait;Independent with transfers;Requires assistive device for independence;Needs assistance with ADLs;Needs assistance with homemaking  Able to Take Stairs? Yes (with A)  Driving No  Vocation Retired  Engineer, building services Level Oriented to person;Disoriented to place;Disoriented to time;Disoriented to situation  Bed Mobility  Bed Mobility Yes  Supine to Sit 4: Min assist  Supine to Sit Details (indicate cue type and reason) cues for encouragement and sequencing  Sitting - Scoot to Edge of Bed 4: Min assist  Sitting - Scoot to Edge of Bed Details (indicate cue type and reason) cues for use of UEs and reciprocal scoot  Transfers  Transfers Yes  Sit to Stand 4: Min assist;With upper extremity assist;From bed  Sit to Stand Details (indicate cue type and reason) cues for use of UEs  Stand to Sit 4: Min assist;With upper extremity  assist;With armrests;To chair/3-in-1  Stand to Sit Details cues for getting closer to chair, use of armrests, control descent  Ambulation/Gait  Ambulation/Gait Yes  Ambulation/Gait Assistance 4: Min assist  Ambulation/Gait Assistance Details (indicate cue type and reason) cues for positioning in RW, safety with RW, upright posture.  pt takes very short, stiff LEs.    Ambulation Distance (Feet) 20 Feet  Assistive device Rolling walker  Gait Pattern Step-through pattern;Decreased stride length;Shuffle;Trunk flexed (very short step length)  Stairs No  Wheelchair Mobility  Wheelchair Mobility No  Posture/Postural Control  Posture/Postural Control No significant limitations  Balance  Balance Assessed No  RLE Assessment  RLE Assessment WFL  LLE Assessment  LLE Assessment WFL  PT - End of Session  Equipment Utilized During Treatment Gait belt  Activity Tolerance Patient tolerated treatment well  Patient left in chair;with call bell in reach;with family/visitor present  Nurse Communication Mobility status for transfers;Mobility status for ambulation  General  Behavior During Session [facility] for tasks performed  Cognition Impaired, at baseline  PT Assessment  Clinical Impression Statement pt presents s/p CHF.  pt with Dementia at baseline and lives with Dtr.  Per Dtr pt needs to be able to ambulate up/down stairs and increase her mobility prior to D/C to home.  pt would benefit from ST-SNF at D/C.    PT Recommendation/Assessment Patient will need skilled PT in the acute care venue  PT Problem List Decreased activity tolerance;Decreased balance;Decreased mobility;Decreased cognition;Decreased  knowledge of use of DME;Decreased safety awareness;Cardiopulmonary status limiting activity  Barriers to Discharge None  PT Therapy Diagnosis  Difficulty walking  PT Plan  PT Frequency Min 2X/week  PT Treatment/Interventions DME instruction;Gait training;Stair training;Functional mobility  training;Therapeutic activities;Therapeutic exercise;Balance training;Patient/family education  PT Recommendation  Follow Up Recommendations Skilled nursing facility  Equipment Recommended Defer to next venue  Individuals Consulted  Consulted and Agree with Results and Recommendations Patient;Family member/caregiver  Family Member Consulted Dtr  Acute Rehab PT Goals  PT Goal Formulation With patient  Time For Goal Achievement 2 weeks  Pt will go Supine/Side to Sit with modified independence  PT Goal: Supine/Side to Sit - Progress Goal set today  Pt will go Sit to Supine/Side with modified independence  PT Goal: Sit to Supine/Side - Progress Goal set today  Pt will go Sit to Stand with modified independence;with upper extremity assist  PT Goal: Sit to Stand - Progress Goal set today  Pt will Ambulate 51 - 150 feet;with supervision;with rolling walker  PT Goal: Ambulate - Progress Goal set today  Pt will Go Up / Down Stairs Flight;with min assist;with rail(s)  PT Goal: Up/Down Stairs - Progress Goal set today    [employer], PT [phone]

## 2012-02-29 NOTE — Progress Notes (Signed)
Subjective: No complains, eating food comfortable.  Objective: Filed Vitals:   02/28/12 1452 02/28/12 2111 02/29/12 0100 02/29/12 0446  BP: 97/47 130/74 105/74 99/60  Pulse: 89 88 89 86  Temp: 98.7 F (37.1 C) 98 F (36.7 C) 97.9 F (36.6 C) 98 F (36.7 C)  TempSrc: Oral     Resp: 20 20 20 20   Height:      Weight:    44.3 kg (97 lb 10.6 oz)  SpO2: 92% 94% 94% 95%   Weight change: -0.3 kg (-10.6 oz)  Intake/Output Summary (Last 24 hours) at 02/29/12 1323 Last data filed at 02/29/12 1024  Gross per 24 hour  Intake    360 ml  Output   1175 ml  Net   -815 ml    General: Alert, awake, oriented x1, in no acute distress.  HEENT: No bruits, no goiter. - JVD. Heart: Regular rate and rhythm, without murmurs, rubs, gallops.  Lungs: Clear to auscultation,  bilateral air movement.  Abdomen: Soft, nontender, nondistended, positive bowel sounds.  Neuro: Grossly intact, nonfocal.   Lab Results:  Basename 02/28/12 0600 02/27/12 2110  NA 140 138  K 4.0 4.3  CL 104 103  CO2 23 22  GLUCOSE 84 140*  BUN 22 23  CREATININE 1.16* 1.10  CALCIUM 8.9 9.1  MG -- --  PHOS -- --    Basename 02/28/12 0600 02/27/12 2110  AST 20 20  ALT 12 14  ALKPHOS 137* 145*  BILITOT 0.5 0.5  PROT 7.0 7.4  ALBUMIN 2.7* 2.9*   No results found for this basename: LIPASE:2,AMYLASE:2 in the last 72 hours  Basename 02/27/12 2110  WBC 10.3  NEUTROABS 9.1*  HGB 11.2*  HCT 35.0*  MCV 86.2  PLT 184    Basename 02/28/12 0056 02/27/12 1912  CKTOTAL 80 --  CKMB 2.0 --  CKMBINDEX -- --  TROPONINI <0.30 <0.30   No components found with this basename: POCBNP:3 No results found for this basename: DDIMER:2 in the last 72 hours No results found for this basename: HGBA1C:2 in the last 72 hours No results found for this basename: CHOL:2,HDL:2,LDLCALC:2,TRIG:2,CHOLHDL:2,LDLDIRECT:2 in the last 72 hours No results found for this basename: TSH,T4TOTAL,FREET3,T3FREE,THYROIDAB in the last 72 hours No  results found for this basename: VITAMINB12:2,FOLATE:2,FERRITIN:2,TIBC:2,IRON:2,RETICCTPCT:2 in the last 72 hours  Micro Results: No results found for this or any previous visit (from the past 240 hour(s)).  Studies/Results: Dg Chest Portable 1 View  02/27/2012  *RADIOLOGY REPORT*  Clinical Data: Weakness and shortness of breath.  PORTABLE CHEST - 1 VIEW  Comparison: 05/10/2010  Findings: The heart is enlarged.  There are interstitial changes of pulmonary edema.  No focal consolidations are identified. Bilateral pleural effusions are suspected.  IMPRESSION: Cardiomegaly and interstitial edema.  Original Report Authenticated By: Patterson Hammersmith, M.D.    Medications: I have reviewed the patient's current medications.  Assessment and plan: Principal Problem:  *CHF, acute on chronic -continue lasix strict I and O's echo on 2011 showed diastolic heart failure. Cardiac markers continue to be negative. -add beta-blocker. -no crackles or JVD on physical exam -continue fluids restriction but liberalize slightly.    Alzheimer's disease Stable, continue home meds.  3.Protein mal nutrition: Nutrition consult.  4.Normocytic anemia: -Follow up with PCP, no history of melena. -PT consult. -SW consult for placement.      LOS: 2 days   Marinda Elk M.D. Pager: [phone] [facility] 02/29/2012, 1:23 PM

## 2012-02-29 NOTE — Discharge Summary (Addendum)
Admit date: 02/27/2012 Discharge date: 03/02/2012  Primary Care Physician:  Terald Sleeper, MD, MD   Discharge Diagnoses:   Active Hospital Problems  Diagnoses Date Noted   . Protein-calorie malnutrition, severe 02/28/2012   . Normocytic anemia 02/28/2012   . Alzheimer's disease 06/14/2009     Resolved Hospital Problems  Diagnoses Date Noted Date Resolved  . CHF, acute on chronic 02/28/2012 02/29/2012  . SOB (shortness of breath) 02/28/2012 02/28/2012     DISCHARGE MEDICATION: Medication List  As of 03/02/2012  8:28 AM   TAKE these medications         alendronate 70 MG tablet   Commonly known as: FOSAMAX   Take 70 mg by mouth every 7 (seven) days. Sundays For osteoporosis. Take with a full glass of water on an empty stomach.      aspirin 81 MG tablet   Take 81 mg by mouth daily.      atorvastatin 20 MG tablet   Commonly known as: LIPITOR   Take 20 mg by mouth daily. For cholesterol      donepezil 5 MG tablet   Commonly known as: ARICEPT   Take 5 mg by mouth daily.      furosemide 40 MG tablet   Commonly known as: LASIX   Take 20 mg by mouth daily.      furosemide 20 MG tablet   Commonly known as: LASIX   Take 1 tablet (20 mg total) by mouth daily.      K-EFFERVESCENT 25 MEQ disintegrating tablet   Generic drug: potassium bicarbonate   Take 25 mEq by mouth daily. Dissolved in water daily. Take with lipitor      lidocaine 5 %   Commonly known as: LIDODERM   Place 1 patch onto the skin daily. Remove & Discard patch within 12 hours or as directed by MD      methylPREDNISolone 4 MG tablet   Commonly known as: MEDROL DOSEPAK   Take 1 tablet (4 mg total) by mouth once. 5 tables po daily for Days 1-3, 4 tablets po daily for days 4-6, 3 tablets po daily days 7-9, 2 tablets po daily 10-12      metoprolol tartrate 12.5 mg Tabs   Commonly known as: LOPRESSOR   Take 0.5 tablets (12.5 mg total) by mouth 2 (two) times daily.      PRILOSEC 20 MG capsule   Generic drug:  omeprazole   Take 20 mg by mouth daily. For reflux      STOOL SOFTENER PO   Take 1 capsule by mouth daily as needed. For constipation      traMADol 50 MG tablet   Commonly known as: ULTRAM   Take 50 mg by mouth every 4 (four) hours as needed. for pain      VOLTAREN 1 % Gel   Generic drug: diclofenac sodium   Apply topically as directed. 4 (four) grams 4 (four) times daily to back as needed for pain.            SIGNIFICANT DIAGNOSTIC STUDIES:  Dg Lumbar Spine Complete  02/07/2012  *RADIOLOGY REPORT*  Clinical Data: Hip pain, leg pain post fall 1 week ago  LUMBAR SPINE - COMPLETE 4+ VIEW  Comparison:  lateral view of the chest 05/10/2010  Findings: Study is markedly limited by abundant bowel gas and significant osteopenia.  There is mild lumbar dextroscoliosis. There is moderate compression fracture of the L4 vertebral body of indeterminate age.  Subacute compression fracture cannot be  excluded.  Clinical correlation is necessary.  There is about 1 cm anterolisthesis L3 on L4 vertebral body.  Further evaluation with CT scan or MRI is recommended. Significant disc space flattening with endplate sclerotic changes noted L5 S1 level.   IMPRESSION:  1.  Limited study by diffuse osteopenia and abundant bowel gas. There is moderate compression fracture of the L4 vertebral body of indeterminate age.  Subacute compression fracture cannot be excluded.  Clinical correlation is necessary.  There is about 1 cm anterolisthesis L3 on L4 vertebral body.  Further evaluation with CT scan or MRI is recommended.  Original Report Authenticated By: Natasha Mead, M.D.   Dg Sacrum/coccyx  02/07/2012  *RADIOLOGY REPORT*  Clinical Data: Lower back/tail bone pain  SACRUM AND COCCYX - 2+ VIEW  Comparison: None.  Findings: No displaced sacrococcygeal fracture is seen.  Degenerative changes of the lower lumbar spine.  Osteopenia.  IMPRESSION: No displaced sacrococcygeal fracture is seen.  Original Report Authenticated By:  Charline Bills, M.D.   Dg Hip Bilateral Vito Berger  02/04/2012  *RADIOLOGY REPORT*  Clinical Data: Larey Seat several nights ago with pain in the pelvis and both hips  BILATERAL HIP WITH PELVIS - 4+ VIEW  Comparison: None.  Findings: There are mild-to-moderate degenerative changes involving both hips left slightly greater than right.  No acute fracture is seen.  There is some irregularity of both inferior pubic and ischial rami most consistent with old fractures.  No acute fracture is seen.  The SI joints are unremarkable.  IMPRESSION: No acute fracture.  Old fractures of the inferior pubic rami.  Original Report Authenticated By: Juline Patch, M.D.   Mr Lumbar Spine Wo Contrast  02/07/2012  *RADIOLOGY REPORT*  Clinical Data: Low back pain with bilateral leg pain.  Fall  MRI LUMBAR SPINE WITHOUT CONTRAST  Technique:  Multiplanar and multiecho pulse sequences of the lumbar spine were obtained without intravenous contrast.  Comparison: Radiographs 02/07/2012  Findings: Mild to moderate dextroscoliosis of the lumbar spine.  6 mm anterior slip L3-4 appears chronic.  Negative for acute or chronic fracture.  Abnormality of L4 on the radiographs apparently was due to scoliosis and not a fracture.  Conus medullaris is normal and terminates at T12-L1.  L1-2:  Mild disc and facet degeneration without stenosis.  L2-3:  Mild disc bulging and spurring.  Facet and ligamentum flavum hypertrophy.  Mild spinal stenosis.  Moderate left foraminal encroachment due to scoliosis and spurring.  L3-4:  6 mm anterior slip.  Diffuse disc bulging.  Severe facet and ligamentum flavum hypertrophy.  Severe spinal stenosis and severe left foraminal encroachment with impingement of the left L3 nerve root in the foramen.  L4-5:  Disc bulging and facet degeneration without spinal stenosis. Posterior subdural fluid collection is present at the L4-5 level measuring approximately 7 x 23 mm.  This has homogeneous fluid signal characteristics on T1 and T2  does not appear to  represent hematoma.  This may be a subdural fluid collection related to severe spinal stenosis at L3-4.  L5 S1:  Mild anterior slip.  Disc and facet degeneration.  Mild right foraminal encroachment.  IMPRESSION: Severe spinal stenosis at L3-4.  Grade 1 slip of L3-4 with severe facet degeneration.  Below this, there is a posterior subdural fluid collection that appears to represent CSF.  Question clinical significance of this fluid collection.  This does not have the appearance of a hematoma or infection.  Marked left foraminal encroachment L2-3 and L3-4. Negative for fracture.  Original  Report Authenticated By: Camelia Phenes, M.D.   Dg Chest Portable 1 View  02/27/2012  *RADIOLOGY REPORT*  Clinical Data: Weakness and shortness of breath.  PORTABLE CHEST - 1 VIEW  Comparison: 05/10/2010  Findings: The heart is enlarged.  There are interstitial changes of pulmonary edema.  No focal consolidations are identified. Bilateral pleural effusions are suspected.  IMPRESSION: Cardiomegaly and interstitial edema.  Original Report Authenticated By: Patterson Hammersmith, M.D.     No results found for this or any previous visit (from the past 240 hour(s)).  BRIEF ADMITTING H & P: 78 years old [place] American woman with history of dementia, diastolic congestive heart failure. Presented to the ER today with shortness of breath started on Tuesday and progressively worsening. Patient denies any chest pain, palpitation, cough. She denies any fever of chills. In the ED chest x-ray showed interstitial edema and proBNP was elevated. This was given Lasix IV and we're asked to admit for further management.  Apparently patient was seen recently in the ER after she fell MRI lumbar spine showed severe spinal stenosis and the DJD with no fractures.Xrays showed No displaced sacrococcygeal fracture.   Active Hospital Problems  Diagnoses Date Noted   . Protein-calorie malnutrition, severe: Patient is 78 years  old. For nutrition consult for calorie count started and ensure. She lives with her daughter who ensures that she has been eating adequately as she feels like eating. We will continue current diet and followup as an outpatient.  02/28/2012   . Normocytic anemia:  The family doesn't not want any further workup. This will have to be followup as an outpatient to  02/28/2012   . Alzheimer's disease: Structurally stable no changes were made  06/14/2009     Resolved Hospital Problems  Diagnoses Date Noted Date Resolved  . CHF, acute on chronic: She does have diastolic heart failure or previous 2-D echo. She was admitted to telemetry floor cardiac enzymes were cycled x3 which were negative her EKG does not show any signs of ischemia. She was started on IV Lasix. Her creatinine started to increase Lasix was decreased and changed to by mouth at home she was not on Lasix, I think it's reasonable to add low dose Lasix and followup with her at the nursing home with. Here will get a be met and titrate her Lasix as tolerated.  We have added low-dose beta blocker. Which will be titrated as an outpatient. At this time her blood pressure is currently borderline low so an ACE inhibitor was not added. This will have to be given as an outpatient.  02/28/2012 02/29/2012  . SOB (shortness of breath) 02/28/2012 02/28/2012     Disposition and Follow-up:  -PCP at nursing facility. -b-met in 2 week. Discharge Orders    Future Orders Please Complete By Expires   Diet - low sodium heart healthy      Diet - low sodium heart healthy      Increase activity slowly      Increase activity slowly        Follow-up Information    Follow up with Terald Sleeper, MD in 2 weeks.          DISCHARGE EXAM:  General: Alert, awake, oriented x1, in no acute distress.  HEENT: No bruits, no goiter. - JVD.  Heart: Regular rate and rhythm, without murmurs, rubs, gallops.  Lungs: Clear to auscultation, bilateral air movement.   Abdomen: Soft, nontender, nondistended, positive bowel sounds.  Neuro: Grossly intact, nonfocal.  Blood pressure 116/72, pulse 67, temperature 98.5 F (36.9 C), temperature source Oral, resp. rate 18, height 4\' 9"  (1.448 m), weight 44.3 kg (97 lb 10.6 oz), SpO2 99.00%.   Basename 03/01/12 0635  NA 140  K 3.8  CL 104  CO2 25  GLUCOSE 110*  BUN 30*  CREATININE 1.21*  CALCIUM 8.7  MG --  PHOS --   No results found for this basename: AST:2,ALT:2,ALKPHOS:2,BILITOT:2,PROT:2,ALBUMIN:2 in the last 72 hours No results found for this basename: LIPASE:2,AMYLASE:2 in the last 72 hours No results found for this basename: WBC:2,NEUTROABS:2,HGB:2,HCT:2,MCV:2,PLT:2 in the last 72 hours Signed: Marinda Elk M.D. 03/02/2012, 8:28 AM

## 2012-02-29 NOTE — Progress Notes (Signed)
  Echocardiogram 2D Echocardiogram has been performed.  Mary Hartman [name] 02/29/2012, 2:43 PM

## 2012-03-01 LAB — BASIC METABOLIC PANEL
Chloride: 104 mEq/L (ref 96–112)
Creatinine, Ser: 1.21 mg/dL — ABNORMAL HIGH (ref 0.50–1.10)
GFR calc Af Amer: 47 mL/min — ABNORMAL LOW (ref >90)
GFR calc non Af Amer: 41 mL/min — ABNORMAL LOW (ref >90)
Potassium: 3.8 mEq/L (ref 3.5–5.1)

## 2012-03-01 MED ORDER — FUROSEMIDE 20 MG PO TABS: 20.0000 mg | ORAL_TABLET | Freq: Every day | ORAL | Status: DC

## 2012-03-01 MED ORDER — FUROSEMIDE 20 MG PO TABS
20.0000 mg | ORAL_TABLET | Freq: Every day | ORAL | Status: DC
  Administered 2012-03-02: 20 mg via ORAL
  Filled 2012-03-01: qty 1

## 2012-03-01 NOTE — Progress Notes (Signed)
Utilization review completed. Kalya Troeger Watson 03/01/2012 

## 2012-03-01 NOTE — Progress Notes (Addendum)
Clinical Social Worker completed psychosocial assessment and placed in shadow chart. Clinical Social Worker spoke with pt daughter via telephone to discuss pt discharge needs. Pt daughter agreeable to SNF search in [place]. Clinical Social Worker to complete FL-2 and initiate SNF search in [place]. Clinical Social Worker to follow up with pt daughter in regard to bed offers. Clinical Social Worker to facilitate pt discharge needs when SNF bed available.  Addendum: Clinical Social Worker contacted pt daughter and provided pt bed offers. Pt daughter provided Clinical Social Worker top five choices and Visual merchandiser to check with facilities tomorrow morning in regard to bed availability. Clinical Social Worker to facilitate pt discharge needs when bed available at SNF.  Jacklynn Lewis, MSW, LCSWA  Clinical Social Work [phone]

## 2012-03-02 NOTE — Progress Notes (Signed)
Nutrition Follow-up  Diet Order:  Low sodium heart healthy PO intake 45-75% Ensure Clinical Strength BID between meals At time of RD visit patient was visibly upset and unable to answer questions about diet. Patient is difficult to understand but was talking about leaving the hospital, possibly a funeral? RN was made aware of the patients state.   Meds: Scheduled Meds:   . aspirin  81 mg Oral Daily  . docusate sodium  100 mg Oral Daily  . donepezil  5 mg Oral Daily  . feeding supplement  237 mL Oral BID BM  . furosemide  20 mg Oral Daily  . lidocaine  1 patch Transdermal Q24H  . metoprolol tartrate  12.5 mg Oral BID  . pantoprazole  40 mg Oral Q1200  . pneumococcal 23 valent vaccine  0.5 mL Intramuscular Tomorrow-1000  . simvastatin  40 mg Oral Daily   Continuous Infusions:  PRN Meds:.acetaminophen, acetaminophen, diclofenac sodium, ondansetron (ZOFRAN) IV, ondansetron, oxyCODONE, traMADol  Labs:  CMP     Component Value Date/Time   NA 140 03/01/2012 0635   K 3.8 03/01/2012 0635   CL 104 03/01/2012 0635   CO2 25 03/01/2012 0635   GLUCOSE 110* 03/01/2012 0635   BUN 30* 03/01/2012 0635   CREATININE 1.21* 03/01/2012 0635   CALCIUM 8.7 03/01/2012 0635   PROT 7.0 02/28/2012 0600   ALBUMIN 2.7* 02/28/2012 0600   AST 20 02/28/2012 0600   ALT 12 02/28/2012 0600   ALKPHOS 137* 02/28/2012 0600   BILITOT 0.5 02/28/2012 0600   GFRNONAA 41* 03/01/2012 0635   GFRAA 47* 03/01/2012 0635     Intake/Output Summary (Last 24 hours) at 03/02/12 1208 Last data filed at 03/02/12 0900  Gross per 24 hour  Intake    690 ml  Output    450 ml  Net    240 ml    Weight Status:  97 lbs, weight stable with admission weight  Re-estimated needs:  1100-1300 kcal, 55-65 gm protein  Nutrition Dx:  Inadequate oral intake, likely resolved. Patient is likely meeting needs through supplements and meal intake  Goal:  Meet >90% of estimated needs for weight maintenance, met  Intervention:  Continue with Ensure as previously  ordered No new interventions at this time  Monitor:  PO intake, weights, labs, I/O's, additional d/c needs   Rudean Haskell Pager #:  [phone]

## 2012-03-02 NOTE — Progress Notes (Signed)
Clinical Social Worker spoke with pt daughter to inform that [facility] are able to offer pt a bed. Pt daughter accepting of bed offer. Per facility, bed available today. Clinical Social Worker to facilitate pt discharge needs.  Jacklynn Lewis, MSW, LCSWA  Clinical Social Work [phone]

## 2012-03-02 NOTE — Progress Notes (Signed)
PT Cancel Note:  Pt d/cing to [facility].    Mary Hartman, [place] [phone] 03/02/2012

## 2012-03-02 NOTE — Progress Notes (Signed)
Clinical Social Worker facilitated pt discharge needs including contacting facility, family, and arranging ambulance transportation for pt to [facility]. No further social work needs at this time. Clinical Social Worker signing off.  Jacklynn Lewis, MSW, LCSWA  Clinical Social Work [phone]

## 2012-03-02 NOTE — Progress Notes (Addendum)
Subjective: No complains.  Objective: Filed Vitals:   03/01/12 0937 03/01/12 1415 03/01/12 2007 03/02/12 0451  BP: 110/70 98/64 104/60 116/72  Pulse: 70 74 70 67  Temp:  98.9 F (37.2 C) 98.1 F (36.7 C) 98.5 F (36.9 C)  TempSrc:  Oral Oral Oral  Resp:  20 18 18   Height:      Weight:    44.3 kg (97 lb 10.6 oz)  SpO2:  98% 94% 99%   Weight change: 1.5 kg (3 lb 4.9 oz)  Intake/Output Summary (Last 24 hours) at 03/02/12 0828 Last data filed at 03/02/12 0339  Gross per 24 hour  Intake    690 ml  Output    650 ml  Net     40 ml    General: Alert, awake, oriented x1, in no acute distress.  HEENT: No bruits, no goiter. - JVD. Heart: Regular rate and rhythm, without murmurs, rubs, gallops.  Lungs: Clear to auscultation,  bilateral air movement.  Abdomen: Soft, nontender, nondistended, positive bowel sounds.  Neuro: Grossly intact, nonfocal.   Lab Results:  [facility] 03/01/12 0635  NA 140  K 3.8  CL 104  CO2 25  GLUCOSE 110*  BUN 30*  CREATININE 1.21*  CALCIUM 8.7  MG --  PHOS --   No results found for this basename: AST:2,ALT:2,ALKPHOS:2,BILITOT:2,PROT:2,ALBUMIN:2 in the last 72 hours No results found for this basename: LIPASE:2,AMYLASE:2 in the last 72 hours No results found for this basename: WBC:2,NEUTROABS:2,HGB:2,HCT:2,MCV:2,PLT:2 in the last 72 hours No results found for this basename: CKTOTAL:3,CKMB:3,CKMBINDEX:3,TROPONINI:3 in the last 72 hours No components found with this basename: POCBNP:3 No results found for this basename: DDIMER:2 in the last 72 hours No results found for this basename: HGBA1C:2 in the last 72 hours No results found for this basename: CHOL:2,HDL:2,LDLCALC:2,TRIG:2,CHOLHDL:2,LDLDIRECT:2 in the last 72 hours No results found for this basename: TSH,T4TOTAL,FREET3,T3FREE,THYROIDAB in the last 72 hours No results found for this basename: VITAMINB12:2,FOLATE:2,FERRITIN:2,TIBC:2,IRON:2,RETICCTPCT:2 in the last 72 hours  Micro Results: No  results found for this or any previous visit (from the past 240 hour(s)).  Studies/Results: No results found.  Medications: I have reviewed the patient's current medications.  Assessment and plan: Principal Problem:  *CHF, acute on chronic -continue lasix orally, echo on 2011 showed diastolic heart failure. Cardiac markers continue to be negative. -On beta-blocker. -no crackles or JVD on physical exam -continue fluids restriction but liberalize slightly.  -will need an ACE as an out patient if BP can tolerate it.   Alzheimer's disease Stable, continue home meds.  3.Protein mal nutrition: Nutrition consult.  4.Normocytic anemia: -Follow up with PCP, no history of melena.  5.Dispo: awaiting placement.      LOS: 4 days   Marinda Elk M.D. Pager: [phone] [facility] 03/02/2012, 8:28 AM

## 2012-07-12 ENCOUNTER — Ambulatory Visit
Admission: RE | Admit: 2012-07-12 | Discharge: 2012-07-12 | Disposition: A | Discharge status: Home / Self Care | Payer: Medicare Other | Source: Ambulatory Visit | Attending: Internal Medicine | Admitting: Internal Medicine

## 2012-07-12 ENCOUNTER — Other Ambulatory Visit (HOSPITAL_BASED_OUTPATIENT_CLINIC_OR_DEPARTMENT_OTHER): Payer: Self-pay | Admitting: Internal Medicine

## 2012-07-12 DIAGNOSIS — R0989 Other specified symptoms and signs involving the circulatory and respiratory systems: Secondary | ICD-10-CM

## 2012-07-12 DIAGNOSIS — R062 Wheezing: Secondary | ICD-10-CM

## 2012-08-11 ENCOUNTER — Other Ambulatory Visit: Payer: Self-pay | Admitting: Family Medicine

## 2012-08-11 ENCOUNTER — Ambulatory Visit
Admission: RE | Admit: 2012-08-11 | Discharge: 2012-08-11 | Disposition: A | Discharge status: Home / Self Care | Payer: No Typology Code available for payment source | Source: Ambulatory Visit | Attending: Family Medicine | Admitting: Family Medicine

## 2012-08-11 DIAGNOSIS — J189 Pneumonia, unspecified organism: Secondary | ICD-10-CM

## 2013-07-16 ENCOUNTER — Emergency Department (HOSPITAL_COMMUNITY): Payer: Medicare (Managed Care)

## 2013-07-16 ENCOUNTER — Encounter (HOSPITAL_COMMUNITY): Payer: Self-pay | Admitting: *Deleted

## 2013-07-16 ENCOUNTER — Inpatient Hospital Stay (HOSPITAL_COMMUNITY)
Admission: EM | Admit: 2013-07-16 | Discharge: 2013-07-21 | DRG: 291 | Disposition: A | Discharge status: Skilled Nursing Facility | Payer: Medicare (Managed Care) | Attending: Internal Medicine | Admitting: Internal Medicine

## 2013-07-16 DIAGNOSIS — I498 Other specified cardiac arrhythmias: Secondary | ICD-10-CM | POA: Diagnosis not present

## 2013-07-16 DIAGNOSIS — M4800 Spinal stenosis, site unspecified: Secondary | ICD-10-CM | POA: Diagnosis present

## 2013-07-16 DIAGNOSIS — I509 Heart failure, unspecified: Secondary | ICD-10-CM | POA: Diagnosis present

## 2013-07-16 DIAGNOSIS — F0390 Unspecified dementia without behavioral disturbance: Secondary | ICD-10-CM | POA: Diagnosis present

## 2013-07-16 DIAGNOSIS — IMO0002 Reserved for concepts with insufficient information to code with codable children: Secondary | ICD-10-CM

## 2013-07-16 DIAGNOSIS — M810 Age-related osteoporosis without current pathological fracture: Secondary | ICD-10-CM | POA: Diagnosis present

## 2013-07-16 DIAGNOSIS — I5023 Acute on chronic systolic (congestive) heart failure: Secondary | ICD-10-CM

## 2013-07-16 DIAGNOSIS — M1990 Unspecified osteoarthritis, unspecified site: Secondary | ICD-10-CM | POA: Diagnosis present

## 2013-07-16 DIAGNOSIS — E873 Alkalosis: Secondary | ICD-10-CM | POA: Diagnosis not present

## 2013-07-16 DIAGNOSIS — M79609 Pain in unspecified limb: Secondary | ICD-10-CM

## 2013-07-16 DIAGNOSIS — I441 Atrioventricular block, second degree: Secondary | ICD-10-CM | POA: Diagnosis not present

## 2013-07-16 DIAGNOSIS — R509 Fever, unspecified: Secondary | ICD-10-CM | POA: Diagnosis present

## 2013-07-16 DIAGNOSIS — D649 Anemia, unspecified: Secondary | ICD-10-CM | POA: Diagnosis present

## 2013-07-16 DIAGNOSIS — I5030 Unspecified diastolic (congestive) heart failure: Secondary | ICD-10-CM

## 2013-07-16 DIAGNOSIS — R0902 Hypoxemia: Secondary | ICD-10-CM

## 2013-07-16 DIAGNOSIS — Z9981 Dependence on supplemental oxygen: Secondary | ICD-10-CM

## 2013-07-16 DIAGNOSIS — J189 Pneumonia, unspecified organism: Secondary | ICD-10-CM

## 2013-07-16 DIAGNOSIS — R634 Abnormal weight loss: Secondary | ICD-10-CM | POA: Diagnosis present

## 2013-07-16 DIAGNOSIS — G309 Alzheimer's disease, unspecified: Secondary | ICD-10-CM | POA: Diagnosis present

## 2013-07-16 DIAGNOSIS — F0280 Dementia in other diseases classified elsewhere without behavioral disturbance: Secondary | ICD-10-CM | POA: Diagnosis present

## 2013-07-16 DIAGNOSIS — E7800 Pure hypercholesterolemia, unspecified: Secondary | ICD-10-CM | POA: Diagnosis present

## 2013-07-16 DIAGNOSIS — J9601 Acute respiratory failure with hypoxia: Secondary | ICD-10-CM

## 2013-07-16 DIAGNOSIS — J9600 Acute respiratory failure, unspecified whether with hypoxia or hypercapnia: Secondary | ICD-10-CM | POA: Diagnosis present

## 2013-07-16 DIAGNOSIS — I5032 Chronic diastolic (congestive) heart failure: Principal | ICD-10-CM | POA: Diagnosis present

## 2013-07-16 DIAGNOSIS — E785 Hyperlipidemia, unspecified: Secondary | ICD-10-CM

## 2013-07-16 LAB — CBC WITH DIFFERENTIAL/PLATELET
Basophils Absolute: 0 10*3/uL (ref 0.0–0.1)
Basophils Relative: 0 % (ref 0–1)
HCT: 34.3 % — ABNORMAL LOW (ref 36.0–46.0)
Lymphocytes Relative: 9 % — ABNORMAL LOW (ref 12–46)
MCHC: 32.1 g/dL (ref 30.0–36.0)
Monocytes Absolute: 0.7 10*3/uL (ref 0.1–1.0)
Neutro Abs: 5.8 10*3/uL (ref 1.7–7.7)
Neutrophils Relative %: 81 % — ABNORMAL HIGH (ref 43–77)
RDW: 14.2 % (ref 11.5–15.5)
WBC: 7.2 10*3/uL (ref 4.0–10.5)

## 2013-07-16 LAB — BASIC METABOLIC PANEL
CO2: 26 mEq/L (ref 19–32)
Chloride: 103 mEq/L (ref 96–112)
Creatinine, Ser: 0.97 mg/dL (ref 0.50–1.10)
GFR calc Af Amer: 61 mL/min — ABNORMAL LOW (ref >90)
Potassium: 4.5 mEq/L (ref 3.5–5.1)
Sodium: 139 mEq/L (ref 135–145)

## 2013-07-16 LAB — POCT I-STAT TROPONIN I: Troponin i, poc: 0.05 ng/mL (ref 0.00–0.08)

## 2013-07-16 LAB — POCT I-STAT 3, ART BLOOD GAS (G3+)
Acid-Base Excess: 2 mmol/L (ref 0.0–2.0)
O2 Saturation: 97 %
pO2, Arterial: 86 mmHg (ref 80.0–100.0)

## 2013-07-16 LAB — D-DIMER, QUANTITATIVE: D-Dimer, Quant: 1.06 ug/mL-FEU — ABNORMAL HIGH (ref 0.00–0.48)

## 2013-07-16 LAB — URINALYSIS, ROUTINE W REFLEX MICROSCOPIC
Bilirubin Urine: NEGATIVE
Ketones, ur: NEGATIVE mg/dL
Nitrite: NEGATIVE
pH: 7 (ref 5.0–8.0)

## 2013-07-16 LAB — PRO B NATRIURETIC PEPTIDE: Pro B Natriuretic peptide (BNP): 1069 pg/mL — ABNORMAL HIGH (ref 0–450)

## 2013-07-16 LAB — URINE MICROSCOPIC-ADD ON

## 2013-07-16 MED ORDER — LEVOFLOXACIN IN D5W 500 MG/100ML IV SOLN: 500.0000 mg | INTRAVENOUS | Status: DC

## 2013-07-16 MED ORDER — LEVOFLOXACIN IN D5W 750 MG/150ML IV SOLN
750.0000 mg | INTRAVENOUS | Status: AC
  Administered 2013-07-16: 750 mg via INTRAVENOUS
  Filled 2013-07-16: qty 150

## 2013-07-16 MED ORDER — FUROSEMIDE 10 MG/ML IJ SOLN
40.0000 mg | Freq: Once | INTRAMUSCULAR | Status: AC
  Administered 2013-07-16: 40 mg via INTRAVENOUS
  Filled 2013-07-16: qty 4

## 2013-07-16 NOTE — ED Provider Notes (Addendum)
History    CSN: [id number] Arrival date & time 07/16/13  1931  First MD Initiated Contact with Patient 07/16/13 1939     Chief Complaint  Patient presents with  . Shortness of Breath   (Consider location/radiation/quality/duration/timing/severity/associated sxs/prior Treatment) HPI Comments: Mary Hartman is a 79 y.o. female who has a home, with her daughter. Today, the daughter noted that she was exhibiting more effort to breathe. The daughter, then checked her pulse ox, and it was low in the 70s. She brought the patient, here for evaluation of shortness of breath, by private vehicle. The problem is acute, starting today. She has a chronic eating disorder with poor appetite. Her weight has been stable within 1 or 2 pounds within the last week. She has a significant weight loss over the last 6 months. Her doctor is working on ways to get her to eat more. She has a nonproductive cough today. No nausea, vomiting, or urinary tract problems. The patient cannot contribute to history.    Level 5 caveat: Altered mental status  Patient is a 79 y.o. female presenting with shortness of breath. The history is provided by a relative.  Shortness of Breath  Past Medical History  Diagnosis Date  . Osteoporosis   . Osteoarthritis   . Hypercholesteremia   . Orthopedic aftercare for healing traumatic leg fracture   . Rib fracture   . Osteoarthritis   . Spinal stenosis   . DEMENTIA    Past Surgical History  Procedure Laterality Date  . Hernia repair    . Orthopedic surgery     No family history on file. History  Substance Use Topics  . Smoking status: Never Smoker   . Smokeless tobacco: Never Used  . Alcohol Use: No   OB History   Grav Para Term Preterm Abortions TAB SAB Ect Mult Living                 Review of Systems  Unable to perform ROS Respiratory: Positive for shortness of breath.     Allergies  Review of patient's allergies indicates no known allergies.  Home  Medications   Current Outpatient Rx  Name  Route  Sig  Dispense  Refill  . acetaminophen (TYLENOL) 325 MG tablet   Oral   Take 325 mg by mouth 2 (two) times daily as needed for pain.         Marland Kitchen aspirin 81 MG chewable tablet   Oral   Chew 81 mg by mouth daily.         . Calcium Carb-Cholecalciferol 600-400 MG-UNIT TABS   Oral   Take 1 tablet by mouth daily.         . citalopram (CELEXA) 10 MG tablet   Oral   Take 10 mg by mouth at bedtime.         . docusate sodium (COLACE) 100 MG capsule   Oral   Take 100 mg by mouth 3 (three) times a week. Tuesday, Thursday, Saturday         . donepezil (ARICEPT) 10 MG tablet   Oral   Take 10 mg by mouth at bedtime.         Marland Kitchen HYDROcodone-acetaminophen (NORCO/VICODIN) 5-325 MG per tablet   Oral   Take 1 tablet by mouth 3 (three) times daily.         . Multiple Vitamin (MULTIVITAMIN WITH MINERALS) TABS   Oral   Take 1 tablet by mouth daily.         Marland Kitchen  Nutritional Supplements (BOOST PO)   Oral   Take 1 Can by mouth daily.          BP 177/90  Pulse 82  Temp(Src) 101.4 F (38.6 C) (Rectal)  Resp 24  SpO2 99% Physical Exam  Nursing note and vitals reviewed. Constitutional: She appears well-developed.  Frail, elderly  HENT:  Head: Normocephalic and atraumatic.  Eyes: Conjunctivae and EOM are normal. Pupils are equal, round, and reactive to light.  Neck: Normal range of motion and phonation normal. Neck supple.  Cardiovascular: Normal rate, regular rhythm and intact distal pulses.   Pulmonary/Chest: Effort normal. No respiratory distress. She exhibits no tenderness.  Rales, bilaterally, about two thirds of the way up; left greater than right  Abdominal: Soft. She exhibits no distension. There is no tenderness. There is no guarding.  Musculoskeletal: Normal range of motion. She exhibits no edema and no tenderness.  Neurological: She is alert. She has normal strength. She exhibits normal muscle tone.  Skin: Skin is  warm and dry.  Psychiatric: Her behavior is normal.    ED Course  Procedures (including critical care time)  Medications  levofloxacin (LEVAQUIN) IVPB 750 mg (not administered)  levofloxacin (LEVAQUIN) IVPB 500 mg (not administered)  furosemide (LASIX) injection 40 mg (40 mg Intravenous Given 07/16/13 2134)   Oxygen, titrated first with facemask then Ventimask to stabilize oxygen saturation to a normal range    Patient Vitals for the past 24 hrs:  BP Temp Temp src Pulse Resp SpO2  07/16/13 2022 - - - - - 99 %  07/16/13 2007 - 101.4 F (38.6 C) Rectal - - -  07/16/13 2002 - - - - - 76 %  07/16/13 1953 177/90 mmHg 101.3 F (38.5 C) Axillary 82 24 100 %  07/16/13 1941 177/90 mmHg - - 93 - 100 %  07/16/13 1936 - - - 98 - 70 %    10:33 PM-Consult complete with TSB Resident. Patient case explained and discussed. She agrees to admit patient for further evaluation and treatment. Call ended at 2240    Date: 07/16/13  Rate: 84  Rhythm: normal sinus rhythm  QRS Axis: normal  PR and QT Intervals: normal  ST/T Wave abnormalities: normal  PR and QRS Conduction Disutrbances:right bundle branch block  Narrative Interpretation:   Old EKG Reviewed: unchanged- 02/27/12     CRITICAL CARE Performed by: Mancel Bale L Total critical care time: 45 minutes Critical care time was exclusive of separately billable procedures and treating other patients. Critical care was necessary to treat or prevent imminent or life-threatening deterioration. Critical care was time spent personally by me on the following activities: development of treatment plan with patient and/or surrogate as well as nursing, discussions with consultants, evaluation of patient's response to treatment, examination of patient, obtaining history from patient or surrogate, ordering and performing treatments and interventions, ordering and review of laboratory studies, ordering and review of radiographic studies, pulse oximetry and  re-evaluation of patient's condition.  Labs Reviewed  CBC WITH DIFFERENTIAL - Abnormal; Notable for the following:    Hemoglobin 11.0 (*)    HCT 34.3 (*)    Neutrophils Relative % 81 (*)    Lymphocytes Relative 9 (*)    All other components within normal limits  BASIC METABOLIC PANEL - Abnormal; Notable for the following:    Glucose, Bld 142 (*)    BUN 25 (*)    GFR calc non Af Amer 53 (*)    GFR calc Af Amer 61 (*)  All other components within normal limits  D-DIMER, QUANTITATIVE - Abnormal; Notable for the following:    D-Dimer, Quant 1.06 (*)    All other components within normal limits  URINALYSIS, ROUTINE W REFLEX MICROSCOPIC - Abnormal; Notable for the following:    Color, Urine STRAW (*)    APPearance CLOUDY (*)    Hgb urine dipstick SMALL (*)    Leukocytes, UA MODERATE (*)    All other components within normal limits  URINE MICROSCOPIC-ADD ON - Abnormal; Notable for the following:    Squamous Epithelial / LPF MANY (*)    Bacteria, UA FEW (*)    All other components within normal limits  POCT I-STAT 3, BLOOD GAS (G3+) - Abnormal; Notable for the following:    pH, Arterial 7.468 (*)    Bicarbonate 25.9 (*)    All other components within normal limits  CULTURE, BLOOD (ROUTINE X 2)  CULTURE, BLOOD (ROUTINE X 2)  URINE CULTURE  PRO B NATRIURETIC PEPTIDE  BLOOD GAS, ARTERIAL  POCT I-STAT TROPONIN I   Dg Chest 2 View  07/16/2013   *RADIOLOGY REPORT*  Clinical Data: CHF versus pneumonia  CHEST - 2 VIEW  Comparison: Prior radiograph performed earlier on the same day 1954  Findings: Cardiac and mediastinal silhouettes are unchanged as compared to the prior examination.  Diffuse prominence of the interstitial markings is again seen, slightly improved as compared to the prior examination, most consistent with improved edema.  No new focal consolidation to suggest pneumonia identified. No pneumothorax.  No definite pleural effusion.  IMPRESSION: Slight interval improvement in  diffuse prominence of the interstitial markings, most consistent with persistent but improved pulmonary edema.  No airspace focal infiltrates to suggest consolidative pneumonia identified.   Original Report Authenticated By: Rise Mu, M.D.   Dg Chest Port 1 View  07/16/2013   *RADIOLOGY REPORT*  Clinical Data: Shortness of breath, nonsmoker  PORTABLE CHEST - 1 VIEW  Comparison: 08/11/2012  Findings: There is diffuse prominence of the interstitial markings with a few Kerley B lines and mild cardiomegaly.  Trace dependent pleural fluid noted.  No pneumothorax.  No focal pulmonary opacity.  IMPRESSION: Constellation of findings suggesting mild interstitial edema. If the patient's symptoms continue, consider PA and lateral chest radiographs obtained at full inspiration when the patient is clinically able.   Original Report Authenticated By: Christiana Pellant, M.D.   1. Congestive heart failure, acute on chronic, systolic   2. Fever   3. Hypoxia     MDM  Suspect multi-factorial, dyspnea; elements of CHF, and pneumonia. Patient is frail, and hypoxic. Please be needed for stabilization.   Nursing Notes Reviewed/ Care Coordinated, and agree without changes. Applicable Imaging Reviewed.  Interpretation of Laboratory Data incorporated into ED treatment   Plan: Admit  Flint Melter, MD 07/16/13 2337  Flint Melter, MD 07/17/13 [phone]

## 2013-07-16 NOTE — ED Notes (Signed)
Arterial blood gas drawn on 40% venturi mask. Placed patient on 4lpm nasal cannula after blood gas. Doctor is aware of change of oxygen device

## 2013-07-16 NOTE — Progress Notes (Signed)
ANTIBIOTIC CONSULT NOTE - INITIAL  Pharmacy Consult for Levofloxacin Indication: pneumonia  No Known Allergies  Patient Measurements:   Adjusted Body Weight: 43  Vital Signs: Temp: 101.4 F (38.6 C) (07/19 2007) Temp src: Rectal (07/19 2007) BP: 177/90 mmHg (07/19 1953) Pulse Rate: 82 (07/19 1953) Intake/Output from previous day:   Intake/Output from this shift:    Labs:  Recent Labs  07/16/13 2020  WBC 7.2  HGB 11.0*  PLT 198  CREATININE 0.97   The CrCl is unknown because both a height and weight (above a minimum accepted value) are required for this calculation. No results found for this basename: VANCOTROUGH, VANCOPEAK, VANCORANDOM, GENTTROUGH, GENTPEAK, GENTRANDOM, TOBRATROUGH, TOBRAPEAK, TOBRARND, AMIKACINPEAK, AMIKACINTROU, AMIKACIN,  in the last 72 hours   Microbiology: No results found for this or any previous visit (from the past 720 hour(s)).  Medical History: Past Medical History  Diagnosis Date  . Osteoporosis   . Osteoarthritis   . Hypercholesteremia   . Orthopedic aftercare for healing traumatic leg fracture   . Rib fracture   . Osteoarthritis   . Spinal stenosis   . DEMENTIA     Medications:  Scheduled:  Assessment: 79yo female with pneumonia.  Estimated CrCl 29 ml/min, dose will be adjusted.  Goal of Therapy:  resolution of infection  Plan:  1.  Levofloxacin 750mg  IV x 1, then 500mg  IV q48 2.  F/U cx, renal function  Marisue Humble, PharmD Clinical Pharmacist  [facility]- [facility]

## 2013-07-16 NOTE — H&P (Signed)
Date: 07/17/2013               Patient Name:  Mary Hartman MRN: [id number]  DOB: 02/18/1928 Age / Sex: 79 y.o., female   PCP: Pcp Not In System         Medical Service: Internal Medicine Teaching Service         Attending Physician: Dr. Jonah Blue, DO    First Contact: Dr. Mariea Clonts Pager: [phone]  Second Contact: Dr. Everardo Beals Pager: [phone]       After Hours (After 5p/  First Contact Pager: [phone]  weekends / holidays): Second Contact Pager: [phone]   Chief Complaint: Worsening SOB, fatigue, SpO2 in the 70's at home.  History of Present Illness: Mary Hartman is a 79 y.o. y/o female w/ PMHx of CHF, hypercholesterolemia, OA, osteoporosis, and spinal stenosis presents to the ED w/ complaints of worsening SOB and fatigue. The patient lives with her daughter and she said that she has recently been more SOB than normal, mostly with exertion. Her daughter has also noticed that she has been fatigued recently and hasn't seemed like her normal self. She checked her SpO2 at home because she was having difficulty breathing and found it to be 74% this evening, which is when they came to the ED. On arrival in the ED, she was found to have a fever of 101.4 as well. The patient denies any other recent issues except for a recent cough productive of green sputum. She denies any recent illness, no fever or chills, no orthopnea or PND, chest pain, nausea, vomiting, dizziness, lightheadedness, LOC, diarrhea, or abdominal pain.  She spends the days at [facility], uses a walker to ambulate and is looked after by her daughter.  Meds: Current Facility-Administered Medications  Medication Dose Route Frequency Provider Last Rate Last Dose  . 0.9 %  sodium chloride infusion  250 mL Intravenous PRN Darden Palmer, MD      . acetaminophen (TYLENOL) tablet 325 mg  325 mg Oral BID PRN Darden Palmer, MD      . albuterol (PROVENTIL) (5 MG/ML) 0.5% nebulizer solution 2.5 mg  2.5 mg Nebulization Q4H PRN Darden Palmer, MD      . aspirin chewable tablet 81 mg  81 mg Oral Daily Darden Palmer, MD      . calcium-vitamin D (OSCAL WITH D) 500-200 MG-UNIT per tablet 1 tablet  1 tablet Oral Daily Darden Palmer, MD      . Melene Muller ON 07/18/2013] docusate sodium (COLACE) capsule 100 mg  100 mg Oral 3 times weekly Darden Palmer, MD      . donepezil (ARICEPT) tablet 10 mg  10 mg Oral QHS Darden Palmer, MD      . enoxaparin (LOVENOX) injection 20 mg  20 mg Subcutaneous Q24H Darden Palmer, MD      . HYDROcodone-acetaminophen (NORCO/VICODIN) 5-325 MG per tablet 1 tablet  1 tablet Oral TID Darden Palmer, MD      . ipratropium (ATROVENT) nebulizer solution 0.5 mg  0.5 mg Nebulization Q4H PRN Darden Palmer, MD      . multivitamin with minerals tablet 1 tablet  1 tablet Oral Daily Darden Palmer, MD      . piperacillin-tazobactam (ZOSYN) IVPB 3.375 g  3.375 g Intravenous Q8H Veronda Hollie Salk, RPH      . sodium chloride 0.9 % injection 3 mL  3 mL Intravenous Q12H Darden Palmer, MD      . sodium chloride 0.9 % injection 3 mL  3 mL  Intravenous Q12H Darden Palmer, MD      . sodium chloride 0.9 % injection 3 mL  3 mL Intravenous PRN Darden Palmer, MD      . vancomycin (VANCOCIN) 500 mg in sodium chloride 0.9 % 100 mL IVPB  500 mg Intravenous Once Colleen Can, RPH 100 mL/hr at 07/17/13 0049 500 mg at 07/17/13 0049  . vancomycin (VANCOCIN) 500 mg in sodium chloride 0.9 % 100 mL IVPB  500 mg Intravenous Q24H Colleen Can, [facility]       Allergies: Allergies as of 07/16/2013  . (No Known Allergies)   Past Medical History  Diagnosis Date  . Osteoporosis   . Osteoarthritis   . Hypercholesteremia   . Orthopedic aftercare for healing traumatic leg fracture   . Rib fracture   . Osteoarthritis   . Spinal stenosis   . DEMENTIA    Past Surgical History  Procedure Laterality Date  . Hernia repair    . Orthopedic surgery     No family history on file. History   Social History  . Marital Status:  Widowed    Spouse Name: N/A    Number of Children: N/A  . Years of Education: N/A   Occupational History  . Not on file.   Social History Main Topics  . Smoking status: Never Smoker   . Smokeless tobacco: Never Used  . Alcohol Use: No  . Drug Use: No  . Sexually Active:    Other Topics Concern  . Not on file   Social History Narrative  . No narrative on file   Review of Systems: General: Denies fever, chills, diaphoresis, appetite change and fatigue.  HEENT: Denies change in vision, eye pain, redness, hearing loss, congestion, sore throat, rhinorrhea, sneezing, mouth sores, trouble swallowing, neck pain, neck stiffness and tinnitus.   Respiratory: Complains of recent SOB, mostly with ambulation. Also complains of a cough productive of greenish sputum. Denies chest tightness, and wheezing.   Cardiovascular: Denies chest pain, palpitations and leg swelling.  Gastrointestinal: Denies nausea, vomiting, abdominal pain, diarrhea, constipation, blood in stool and abdominal distention.  Genitourinary: Denies dysuria, urgency, frequency, hematuria, flank pain and difficulty urinating.  Endocrine: Denies hot or cold intolerance, sweats, polyuria, polydipsia. Musculoskeletal: Denies myalgias, back pain, joint swelling, arthralgias and gait problem.  Skin: Denies pallor, rash and wounds.  Neurological: Denies dizziness, seizures, syncope, weakness, lightheadedness, numbness and headaches.  Hematological: Denies adenopathy,easy bruising, personal or family bleeding history.  Psychiatric/Behavioral: Denies mood changes, confusion, nervousness, sleep disturbance and agitation.  Physical Exam: Blood pressure 141/74, pulse 83, temperature 98.8 F (37.1 C), temperature source Oral, resp. rate 21, height 4' 9.09" (1.45 m), weight 88 lb 6.5 oz (40.1 kg), SpO2 98.00%. Filed Vitals:   07/16/13 2007 07/16/13 2022 07/16/13 2300 07/17/13 0111  BP:   162/82 141/74  Pulse:   79 83  Temp: 101.4 F  (38.6 C)   98.8 F (37.1 C)  TempSrc: Rectal   Oral  Resp:   23 21  Height:    4' 9.09" (1.45 m)  Weight:    88 lb 6.5 oz (40.1 kg)  SpO2:  99% 97% 98%   General: Vital signs reviewed.  Patient is an elderly female, in mild respiratory distress, cooperative with exam. Alert and oriented x3.  Head: Normocephalic and atraumatic. Nose: No erythema or drainage noted.  Turbinates normal. Mouth: No erythema, exudates, sores, or ulcerations. Moist mucus membranes. Eyes: PERRL, EOMI, conjunctivae normal, No scleral icterus.  Neck: Supple, trachea  midline, normal ROM, No JVD, masses, thyromegaly, or carotid bruit present. Carotid pulse visible.  Cardiovascular: RRR, S1 normal, S2 normal, no murmurs, gallops, or rubs. Pulmonary/Chest: In mild respiratory distress, on venturi mask. Air entry equal bilaterally. Significant crackles heard in mid and lower lung fields bilaterally. Abdominal: Soft. Non-tender, non-distended, bowel sounds are normal, no masses, organomegaly, or guarding present.  Musculoskeletal: Arthritic changes seen in hands. Left hand w/ deformity from previous accident. No erythema, or stiffness, ROM full and no nontender. Extremities: Trace edema in LE's. Pulses symmetric and intact bilaterally. No cyanosis or clubbing. Neurological: A&O x3, Strength is normal and symmetric bilaterally, cranial nerve II-XII are grossly intact, no focal motor deficit, sensory intact to light touch bilaterally.  Skin: Warm, dry and intact. No rashes or erythema. Psychiatric: Normal mood and affect.   Lab results: Basic Metabolic Panel:  Recent Labs  16/10/96 2020  NA 139  K 4.5  CL 103  CO2 26  GLUCOSE 142*  BUN 25*  CREATININE 0.97  CALCIUM 9.2   Liver Function Tests: No results found for this basename: AST, ALT, ALKPHOS, BILITOT, PROT, ALBUMIN,  in the last 72 hours No results found for this basename: LIPASE, AMYLASE,  in the last 72 hours No results found for this basename: AMMONIA,   in the last 72 hours CBC:  Recent Labs  07/16/13 2020  WBC 7.2  NEUTROABS 5.8  HGB 11.0*  HCT 34.3*  MCV 86.2  PLT 198   BNP:  Recent Labs  07/16/13 2305  PROBNP 1069.0*   D-Dimer:  Recent Labs  07/16/13 2020  DDIMER 1.06*    Recent Labs  07/16/13 2200  COLORURINE STRAW*  LABSPEC 1.009  PHURINE 7.0  GLUCOSEU NEGATIVE  HGBUR SMALL*  BILIRUBINUR NEGATIVE  KETONESUR NEGATIVE  PROTEINUR NEGATIVE  UROBILINOGEN 0.2  NITRITE NEGATIVE  LEUKOCYTESUR MODERATE*   Imaging results:  Dg Chest 2 View  07/16/2013   *RADIOLOGY REPORT*  Clinical Data: CHF versus pneumonia  CHEST - 2 VIEW  Comparison: Prior radiograph performed earlier on the same day 1954  Findings: Cardiac and mediastinal silhouettes are unchanged as compared to the prior examination.  Diffuse prominence of the interstitial markings is again seen, slightly improved as compared to the prior examination, most consistent with improved edema.  No new focal consolidation to suggest pneumonia identified. No pneumothorax.  No definite pleural effusion.  IMPRESSION: Slight interval improvement in diffuse prominence of the interstitial markings, most consistent with persistent but improved pulmonary edema.  No airspace focal infiltrates to suggest consolidative pneumonia identified.   Original Report Authenticated By: Rise Mu, M.D.   Dg Chest Port 1 View  07/16/2013   *RADIOLOGY REPORT*  Clinical Data: Shortness of breath, nonsmoker  PORTABLE CHEST - 1 VIEW  Comparison: 08/11/2012  Findings: There is diffuse prominence of the interstitial markings with a few Kerley B lines and mild cardiomegaly.  Trace dependent pleural fluid noted.  No pneumothorax.  No focal pulmonary opacity.  IMPRESSION: Constellation of findings suggesting mild interstitial edema. If the patient's symptoms continue, consider PA and lateral chest radiographs obtained at full inspiration when the patient is clinically able.   Original Report  Authenticated By: Christiana Pellant, M.D.   Other results: EKG: NSR @ 84 BPM. RBBB and prolonged QTc. Unchanged from previous ECG.  Assessment & Plan by Problem: Acute respiratory failure with hypoxia-improved.  Likely secondary to CHF exacerbation.  Hx of grade 1 diastolic dysfunction per prior Echo from 02/2012 with EF estimated >70% and severe  concentric  hypertrophy.  Per daughter, Ms. Dake Lasix and BB were stopped in September 2014 due to bradycardia and hypotension.  Noted to be dyspneic and desat down to low 70s% on room air at home and 70% o2 sat in ED on arrival.  O2 sats improved to >95% with venti mask.  Initial portable cxr suggestive of mild interstitial edema with few Kerley B lines and mild cardiomegaly and repeat CXR was most consistent with persistent but improved pulmonary edema.  Very thin female with no lower extremity edema but b/l lower lung crackles noted on auscultation.  Weight appears stable since march 2013 ~97lbs.  Given Tmax of 101.71F in ED along with dyspnea, intermittent productive cough, and hypoxia, concern for possible PNA (HCAP) as well.  However, repeat CXR did not suggest consolidative PNA and no leukocytosis.  Wheezing was noted on auscultation.  Will also rule out for possible ACS although no chest pain, troponin I poc 0.05 on admission, and no ischemic changes noted on EKG compared to prior.  Finally, PE is also included in the differential with noted elevated D-dimer of 1.06.  However, hypoxia improved, no tachycardia, hemoptysis, lower extremity pain, or travel.  PE probability low/unlikely based on wells score and intermediate probability with Geneva score due to age and HR >75 on admission.  ABG at time of admission: 7.472/35.5/84/25.9/97%.  Calculated A-a gradient of 21.73mmHg which is less than the expected A-a gradient of 24.74mmHg for age.   -admit to SDU given initial hypoxia, fever, and hypotension -tele monitoring -[place] O2, keep o2 sats >92% -given Lasix 40mg   IV x1 in ED -daily weights -strict I&O -EKG in AM -proBNP -Echo -cycle CE -AM labs: cbc, cmet -blood cultures x2  -sputum culture -urine legionella and strep pneumonia antigen -empiric therapy with IV vancomycin and Zosyn for now -duoneb prn  Fever-Tmax 101.71F in ED.  Possibly secondary to UTI (U/A on admission straw, cloudy, moderate leukocytes with 7-10 wbc with negative nitrite) although patient denies dysuria but does have frequency especially in the setting of Lasix dose.  Fever could also be secondary to possible PNA, as such will treat empirically with Vancomycin and Zosyn at this time.  Originally was ordered to be given Levaquin per ED, but we changed to Vancomycin and Zosyn in setting of prolonged qtc.  No leukocytosis on admission. -please see above plan   -f/u urine, sputum, and blood cultures -tylenol prn fever -am labs: cbc  Spinal stenosis-chronic, complains of constant RUE pain. On home regimen of Vicodin 5-325mg  1 tablet TID.   -resume home medications  Anemia-Hb on admission 11 with MCV 86.2.  Appears chronic since 2011.   -[facility] -consider iron panel for further work up  [employer] of Alzheimer's disease.  On Aricept at home.  Able to appropriately answer questions and alert and oriented x3 at time of admission.   -continue home dose Aricept  Prolonged QTc---551 ms on ECG obtained in ED. Slightly longer than previous ECG done in 02/2012 (519) -Avoid possible QT prolonging medications  DVT Prophylaxis: Lovenox  Started on heart healthy diet  Dispo: Disposition is deferred at this time, awaiting improvement of current medical problems. Anticipated discharge in approximately 2 day(s).   The patient does have a current PCP (Dr. Dorise Hiss) and does not need an [facility] hospital follow-up appointment after discharge.  The patient does not have transportation limitations that hinder transportation to clinic appointments.  Signed: Lars Masson,  MD 07/17/2013, 1:37 AM

## 2013-07-16 NOTE — ED Notes (Signed)
Pt family reports SHOB started this AM. Pt has a HX of CHF . On arrival to room Pt was hypoxic 77% on room air. Pt placed on a 100% face mask.

## 2013-07-17 ENCOUNTER — Inpatient Hospital Stay (HOSPITAL_COMMUNITY): Payer: Medicare (Managed Care)

## 2013-07-17 DIAGNOSIS — J189 Pneumonia, unspecified organism: Secondary | ICD-10-CM

## 2013-07-17 DIAGNOSIS — I5032 Chronic diastolic (congestive) heart failure: Secondary | ICD-10-CM | POA: Diagnosis present

## 2013-07-17 LAB — GLUCOSE, CAPILLARY
Glucose-Capillary: 140 mg/dL — ABNORMAL HIGH (ref 70–99)
Glucose-Capillary: 105 mg/dL — ABNORMAL HIGH (ref 70–99)

## 2013-07-17 LAB — CBC
HCT: 34.6 % — ABNORMAL LOW (ref 36.0–46.0)
MCH: 28 pg (ref 26.0–34.0)
MCV: 85.9 fL (ref 78.0–100.0)
Platelets: 173 10*3/uL (ref 150–400)
RBC: 4.03 MIL/uL (ref 3.87–5.11)

## 2013-07-17 LAB — URINALYSIS, ROUTINE W REFLEX MICROSCOPIC
Bilirubin Urine: NEGATIVE
Glucose, UA: NEGATIVE mg/dL
Ketones, ur: NEGATIVE mg/dL
Protein, ur: NEGATIVE mg/dL

## 2013-07-17 LAB — URINE MICROSCOPIC-ADD ON

## 2013-07-17 LAB — TROPONIN I
Troponin I: <0.3 ng/mL (ref <0.30)
Troponin I: <0.3 ng/mL (ref <0.30)
Troponin I: <0.3 ng/mL (ref <0.30)

## 2013-07-17 LAB — HEMOGLOBIN A1C: Hgb A1c MFr Bld: 6 % — ABNORMAL HIGH (ref <5.7)

## 2013-07-17 MED ORDER — SODIUM CHLORIDE 0.9 % IJ SOLN: 3.0000 mL | INTRAMUSCULAR | Status: DC | PRN

## 2013-07-17 MED ORDER — SODIUM CHLORIDE 0.9 % IV SOLN: 250.0000 mL | INTRAVENOUS | Status: DC | PRN

## 2013-07-17 MED ORDER — PIPERACILLIN-TAZOBACTAM 3.375 G IVPB 30 MIN
3.3750 g | Freq: Once | INTRAVENOUS | Status: AC
  Administered 2013-07-17: 3.375 g via INTRAVENOUS
  Filled 2013-07-17: qty 50

## 2013-07-17 MED ORDER — ALBUTEROL SULFATE (5 MG/ML) 0.5% IN NEBU: 2.5000 mg | INHALATION_SOLUTION | RESPIRATORY_TRACT | Status: DC | PRN

## 2013-07-17 MED ORDER — SODIUM CHLORIDE 0.9 % IJ SOLN
3.0000 mL | Freq: Two times a day (BID) | INTRAMUSCULAR | Status: DC
  Administered 2013-07-17 – 2013-07-21 (×7): 3 mL via INTRAVENOUS

## 2013-07-17 MED ORDER — IPRATROPIUM BROMIDE 0.02 % IN SOLN: 0.5000 mg | RESPIRATORY_TRACT | Status: DC | PRN

## 2013-07-17 MED ORDER — FUROSEMIDE 10 MG/ML IJ SOLN
40.0000 mg | Freq: Two times a day (BID) | INTRAMUSCULAR | Status: DC
  Administered 2013-07-17: 40 mg via INTRAVENOUS
  Filled 2013-07-17 (×3): qty 4

## 2013-07-17 MED ORDER — DOCUSATE SODIUM 100 MG PO CAPS
100.0000 mg | ORAL_CAPSULE | ORAL | Status: DC
  Administered 2013-07-18: 100 mg via ORAL
  Filled 2013-07-17 (×3): qty 1

## 2013-07-17 MED ORDER — ASPIRIN 81 MG PO CHEW
81.0000 mg | CHEWABLE_TABLET | Freq: Every day | ORAL | Status: DC
  Administered 2013-07-17 – 2013-07-21 (×5): 81 mg via ORAL
  Filled 2013-07-17 (×5): qty 1

## 2013-07-17 MED ORDER — VANCOMYCIN HCL 500 MG IV SOLR
500.0000 mg | Freq: Once | INTRAVENOUS | Status: AC
  Administered 2013-07-17: 500 mg via INTRAVENOUS
  Filled 2013-07-17: qty 500

## 2013-07-17 MED ORDER — SODIUM CHLORIDE 0.9 % IJ SOLN
3.0000 mL | Freq: Two times a day (BID) | INTRAMUSCULAR | Status: DC
  Administered 2013-07-17 – 2013-07-21 (×8): 3 mL via INTRAVENOUS

## 2013-07-17 MED ORDER — PIPERACILLIN-TAZOBACTAM 3.375 G IVPB
3.3750 g | Freq: Three times a day (TID) | INTRAVENOUS | Status: DC
  Administered 2013-07-17 – 2013-07-18 (×5): 3.375 g via INTRAVENOUS
  Filled 2013-07-17 (×8): qty 50

## 2013-07-17 MED ORDER — VANCOMYCIN HCL 500 MG IV SOLR
500.0000 mg | INTRAVENOUS | Status: AC
  Administered 2013-07-17 – 2013-07-18 (×2): 500 mg via INTRAVENOUS
  Filled 2013-07-17 (×2): qty 500

## 2013-07-17 MED ORDER — CALCIUM CARBONATE-VITAMIN D 500-200 MG-UNIT PO TABS
1.0000 | ORAL_TABLET | Freq: Every day | ORAL | Status: DC
  Administered 2013-07-17 – 2013-07-21 (×5): 1 via ORAL
  Filled 2013-07-17 (×5): qty 1

## 2013-07-17 MED ORDER — ACETAMINOPHEN 325 MG PO TABS
325.0000 mg | ORAL_TABLET | Freq: Two times a day (BID) | ORAL | Status: DC | PRN
  Administered 2013-07-17 (×2): 325 mg via ORAL
  Filled 2013-07-17 (×2): qty 1

## 2013-07-17 MED ORDER — DONEPEZIL HCL 10 MG PO TABS
10.0000 mg | ORAL_TABLET | Freq: Every day | ORAL | Status: DC
  Administered 2013-07-17 – 2013-07-20 (×5): 10 mg via ORAL
  Filled 2013-07-17 (×8): qty 1

## 2013-07-17 MED ORDER — ADULT MULTIVITAMIN W/MINERALS CH
1.0000 | ORAL_TABLET | Freq: Every day | ORAL | Status: DC
  Administered 2013-07-17 – 2013-07-21 (×5): 1 via ORAL
  Filled 2013-07-17 (×5): qty 1

## 2013-07-17 MED ORDER — HYDROCODONE-ACETAMINOPHEN 5-325 MG PO TABS
1.0000 | ORAL_TABLET | Freq: Three times a day (TID) | ORAL | Status: DC
  Administered 2013-07-17 – 2013-07-21 (×13): 1 via ORAL
  Filled 2013-07-17 (×14): qty 1

## 2013-07-17 MED ORDER — ENOXAPARIN SODIUM 30 MG/0.3ML ~~LOC~~ SOLN
20.0000 mg | SUBCUTANEOUS | Status: DC
  Administered 2013-07-17 – 2013-07-21 (×4): 20 mg via SUBCUTANEOUS
  Filled 2013-07-17 (×9): qty 0.2

## 2013-07-17 NOTE — Progress Notes (Signed)
INTERNAL MEDICINE TEACHING SERVICE Night Float Progress Note   Subjective:    We were called overnight by the RN for evaluation of non-sustained bradycardia that appeared to be a Mobitz type II AV block. These episodes only last for 2-5 seconds and can result in bradycardia into the 30's. At time of evaluation, pt was comfortable in her bed, no complaints of dizziness, or lightheadedness. ECG was repeated, showed no change from previous ECG and did not capture an episode of brady. Cardiology was consulted and said there was nothing acutely to be done at this time, however she will need follow-up regarding this issue at a later date.   Objective:    BP 141/74  Pulse 83  Temp(Src) 98.8 F (37.1 C) (Oral)  Resp 21  Ht 4' 9.09" (1.45 m)  Wt 88 lb 6.5 oz (40.1 kg)  BMI 19.07 kg/m2  SpO2 98%   Labs: Basic Metabolic Panel:    Component Value Date/Time   NA 139 07/16/2013 2020   K 4.5 07/16/2013 2020   CL 103 07/16/2013 2020   CO2 26 07/16/2013 2020   BUN 25* 07/16/2013 2020   CREATININE 1.05 07/17/2013 0211   GLUCOSE 142* 07/16/2013 2020   CALCIUM 9.2 07/16/2013 2020    CBC:    Component Value Date/Time   WBC 7.2 07/17/2013 0211   HGB 11.3* 07/17/2013 0211   HCT 34.6* 07/17/2013 0211   PLT 173 07/17/2013 0211   MCV 85.9 07/17/2013 0211   NEUTROABS 5.8 07/16/2013 2020   LYMPHSABS 0.7 07/16/2013 2020   MONOABS 0.7 07/16/2013 2020   EOSABS 0.1 07/16/2013 2020   BASOSABS 0.0 07/16/2013 2020    Cardiac Enzymes: Lab Results  Component Value Date   CKTOTAL 80 02/28/2012   CKMB 2.0 02/28/2012   TROPONINI <0.30 07/17/2013    Physical Exam: General: Vital signs reviewed and noted. Well-developed, well-nourished, in no acute distress; alert, appropriate and cooperative throughout examination.  Lungs:  Normal respiratory effort. Crackles heard in lower lung fields.  Heart: RRR. S1 and S2 normal without gallop, murmur, or rubs.  Abdomen:  BS normoactive. Soft, Nondistended, non-tender.  No masses  or organomegaly.  Extremities: No pretibial edema.   ECG: NSR @ 81 bpm, no changes from previous ECG.  Assessment/ Plan:    Non-sustained Mobitz type II w/ bradycardia into the 30's. Patient asymptomatic. Episodes spontaneously resolve. Discussed w/ Dr. Antoine Poche. Will continue to monitor for now. Recommend outpatient follow-up. Will continue to monitor closely in step-down unit. If patient becomes symptomatic, will need to formally consult cardiology and assess further. Will discuss with day team.   Lars Masson, MD  07/17/2013, 3:20 AM

## 2013-07-17 NOTE — Progress Notes (Signed)
*  PRELIMINARY RESULTS* Echocardiogram 2D Echocardiogram has been performed.  Jeryl Columbia 07/17/2013, 3:38 PM

## 2013-07-17 NOTE — H&P (Signed)
INTERNAL MEDICINE TEACHING SERVICE Attending Admission Note  Date: 07/17/2013  Patient name: Mary Hartman  Medical record number: [id number]  Date of birth: June 29, 1928    I have seen and evaluated Mary Hartman and discussed their care with the Residency Team.  85 yr. Old AAF w/ hx HFpEF, HL, OA, osteoporosis, lumbar spinal stenosis, was sent to the ED due to SOB and fatigue. Per reports, she was noted to have O2 sats in the 70% range at home.  She currently lives with her daughter and has recently been having increased SOB. The patient is a poor historian, but does admit to recent cough of productive green sputum.  There is no hx of diarrhea, abdominal pain, dysuria, vomiting. In the ED she was noted to be febrile to 101.3 F axillary and 101.4 F rectally.  She was initially placed on a NRB due to tachypnea and hx hypoxia with O2 sats of 77% in the ED on arrival.  She was subsequently noted to have a 2nd degree type 2 AVB.   She was given 40 mg IV lasix in the ED with reported improvement and placement on 2 L [place].  CXR most consistent with likely interstitial edema but infiltrate cannot be excluded.   This morning, she states she feels "about the same".  She is noted to be on 2 L [place] O2 sat 99% and RR 17.  She has fine basilar crackles on exam. When asked if she has CP or SOB, she states she has right lower extremity pain. She denies any recent trauma, but states she had some hardware in this extremity years ago due to an accident. Both her LE have palpable distal pulses and are warm. There are not gross deformities. She has intact passive range of motion on LE exam without limitation, she is not able to give me much effort on active LE ROM exam.   At this time, given her significant LVH and HFpEF hx, I would continue her on Lasix 40 mg IV BID and monitor BMP twice daily.  Her BP and UOP needs to be followed. Given her fever, I would be cautious and treat for now for possible HCAP and follow cultures.  I would repeat her CXR in the morning to see if it has cleared.  Her UA needs to be repeated, this was a dirty sample. As far as her RLE, I would obtain plain films of her foot/ankle/distal leg.  Given her hypoxia and +D dimer I would obtain a RLE doppler U/S to rule out DVT.    Follow on telemetry. She has ruled out for ACS. Repeat TTE to re-evaluate EF. Stop Levaquin due to prolonged QTc.   Jonah Blue, [place] 7/20/201412:46 PM

## 2013-07-17 NOTE — Progress Notes (Addendum)
Subjective: Complaints of  Rt leg pain. Family not present to get better hx, as patient speech is hard to understand. Nurse called back to say the px complained of pain in the left leg.  Objective: Vital signs in last 24 hours: Filed Vitals:   07/17/13 0700 07/17/13 0800 07/17/13 0900 07/17/13 1000  BP: 151/60 117/64 160/87   Pulse: 63 68 63 74  Temp:  97.9 F (36.6 C)    TempSrc:  Oral    Resp: 16 28 19 17   Height:      Weight:      SpO2: 99% 99% 100% 98%   Weight change:   Intake/Output Summary (Last 24 hours) at 07/17/13 1205 Last data filed at 07/17/13 0900  Gross per 24 hour  Intake    410 ml  Output    100 ml  Net    310 ml   General appearance: alert, cooperative and appears stated age Head: Normocephalic, without obvious abnormality, atraumatic Lungs: fine bi basilar crackles Heart: regular rate and rhythm, S1, S2 normal, no murmur, click, rub or gallop Abdomen: soft, non-tender; bowel sounds normal; no masses,  no organomegaly Extremities: extremities normal, atraumatic, no cyanosis or edema  Lab Results: Basic Metabolic Panel:  Recent Labs Lab 07/16/13 2020 07/17/13 0211  NA 139  --   K 4.5  --   CL 103  --   CO2 26  --   GLUCOSE 142*  --   BUN 25*  --   CREATININE 0.97 1.05  CALCIUM 9.2  --    CBC:  Recent Labs Lab 07/16/13 2020 07/17/13 0211  WBC 7.2 7.2  NEUTROABS 5.8  --   HGB 11.0* 11.3*  HCT 34.3* 34.6*  MCV 86.2 85.9  PLT 198 173   Cardiac Enzymes:  Recent Labs Lab 07/17/13 0211 07/17/13 0925  TROPONINI <0.30 <0.30   BNP:  Recent Labs Lab 07/16/13 2305  PROBNP 1069.0*   D-Dimer:  Recent Labs Lab 07/16/13 2020  DDIMER 1.06*   Urinalysis:  Recent Labs Lab 07/16/13 2200  COLORURINE STRAW*  LABSPEC 1.009  PHURINE 7.0  GLUCOSEU NEGATIVE  HGBUR SMALL*  BILIRUBINUR NEGATIVE  KETONESUR NEGATIVE  PROTEINUR NEGATIVE  UROBILINOGEN 0.2  NITRITE NEGATIVE  LEUKOCYTESUR MODERATE*    Studies/Results: Dg Chest  2 View  07/16/2013   *RADIOLOGY REPORT* IMPRESSION: Slight interval improvement in diffuse prominence of the interstitial markings, most consistent with persistent but improved pulmonary edema.  No airspace focal infiltrates to suggest consolidative pneumonia identified.   Original Report Authenticated By: Rise Mu, M.D.   Dg Chest Port 1 View  07/16/2013   *RADIOLOGY REPORT*   IMPRESSION: Constellation of findings suggesting mild interstitial edema. If the patient's symptoms continue, consider PA and lateral chest radiographs obtained at full inspiration when the patient is clinically able.   Original Report Authenticated By: Christiana Pellant, M.D.   Medications: I have reviewed the patient's current medications. Scheduled Meds: . aspirin  81 mg Oral Daily  . calcium-vitamin D  1 tablet Oral Daily  . [START ON 07/18/2013] docusate sodium  100 mg Oral 3 times weekly  . donepezil  10 mg Oral QHS  . enoxaparin (LOVENOX) injection  20 mg Subcutaneous Q24H  . HYDROcodone-acetaminophen  1 tablet Oral TID  . multivitamin with minerals  1 tablet Oral Daily  . piperacillin-tazobactam (ZOSYN)  IV  3.375 g Intravenous Q8H  . sodium chloride  3 mL Intravenous Q12H  . sodium chloride  3 mL Intravenous Q12H  .  vancomycin  500 mg Intravenous Q24H   Continuous Infusions:  PRN Meds:.sodium chloride, acetaminophen, albuterol, ipratropium, sodium chloride Assessment/Plan:  # Acute Respiratory failure with hypoxia, possibly due to Health care acquired pneumonia- In the light of her Hx of cough with productive sputum, SOB and fever. We are treating for possible CAP with Pip-Tazo and Vanc. Frusemide- 40mg - was given in the ED with significant improvement. Spo2- current 97% on room air. -Chronic diastolic heart failure is also a likely aetiology, considering hx of dCHF, chest xray findings, clinical improvement on Lasix. Will repeat chest xray in the morning to see if there is resolution of pulm  congestion, as would occur if SOB was due to Pulmonary edema. Since chest xray is not particularly suggestive of infection, investigation to look for other source of infection-  -Repeat Urinalysis, as 1st sample was a dirty sample. - Await blood culture results. - repeat Xray tomorrow. -Iv frusemide- 40 mg BID. # Leg Pain- Complaints of Rt leg pain today, and later information that [patient complained of Lt leg pain also. - Therefore Xrays of Left and right ankle and foot. -Venous doppler to R/o DVT, as patient D.Dimer is elevated-1.06 , and px also has SOB.  # Spinal stenosis-chronic, complains of constant RUE pain. On home regimen of Vicodin 5-325mg  1 tablet TID.  -resume home medications   # Anemia-Hb on admission 11 with MCV 86.2. Appears chronic since 2011.  [facility]  - No interventions at this time. # Dementia-hx of Alzheimer's disease. On Aricept at home. Able to appropriately answer questions and alert and oriented x3 at time of admission.  -continue home dose Aricept #DVT PPX- Enoxaparin.     Dispo: Disposition is deferred at this time, awaiting improvement of current medical problems.   The patient does have a current PCP ( Dr. Dorise Hiss ) and does need an [facility] hospital follow-up appointment after discharge.  The patient does not have transportation limitations that hinder transportation to clinic appointments.  .Services Needed at time of discharge: Y = Yes, Blank = No PT:   OT:   RN:   Equipment:   Other:     LOS: 1 day   Kennis Carina, MD 07/17/2013, 12:05 PM

## 2013-07-17 NOTE — Progress Notes (Signed)
Notified MD that patient is having nonsustained periods bradycardia, when viewed on telemetry appears to be missed beats or 2nd degree AV block type 2. Patient is asymptomatic and otherwise NSR in the 70's.  BP 173/77 most recently ;continuing to monitor patient.

## 2013-07-17 NOTE — Progress Notes (Signed)
ANTIBIOTIC CONSULT NOTE - INITIAL  Pharmacy Consult for Vancocin and Zosyn Indication: fever of unknown source  No Known Allergies  Patient Measurements: Weight: ~44kg  Vital Signs: Temp: 101.4 F (38.6 C) (07/19 2007) Temp src: Rectal (07/19 2007) BP: 177/90 mmHg (07/19 1953) Pulse Rate: 82 (07/19 1953)  Labs:  Recent Labs  07/16/13 2020  WBC 7.2  HGB 11.0*  PLT 198  CREATININE 0.97    Microbiology: No results found for this or any previous visit (from the past 720 hour(s)).  Medical History: Past Medical History  Diagnosis Date  . Osteoporosis   . Osteoarthritis   . Hypercholesteremia   . Orthopedic aftercare for healing traumatic leg fracture   . Rib fracture   . Osteoarthritis   . Spinal stenosis   . DEMENTIA     Assessment: 79yo female c/o worsening SOB and fatigue, daughter checked home SpO2 which was 74%, on arrival to ED also found to have temp of 101.4, initial CXR negative, UA abnormal to begin IV ABX; had been started on Levaquin but QTc prolonged, to change to broader spectrum until C/S.  Goal of Therapy:  Vancomycin trough level 15-20 mcg/ml  Plan:  Will begin vancomycin 500mg  IV Q24H and Zosyn 3.375g IV Q8H and monitor CBC, Cx, levels prn.  Vernard Gambles, PharmD, BCPS  07/17/2013,12:26 AM

## 2013-07-18 ENCOUNTER — Inpatient Hospital Stay (HOSPITAL_COMMUNITY): Payer: Medicare (Managed Care)

## 2013-07-18 DIAGNOSIS — M79609 Pain in unspecified limb: Secondary | ICD-10-CM

## 2013-07-18 DIAGNOSIS — R509 Fever, unspecified: Secondary | ICD-10-CM

## 2013-07-18 LAB — URINE CULTURE: Colony Count: 8000

## 2013-07-18 LAB — BASIC METABOLIC PANEL
BUN: 23 mg/dL (ref 6–23)
BUN: 23 mg/dL (ref 6–23)
CO2: 30 mEq/L (ref 19–32)
Chloride: 100 mEq/L (ref 96–112)
Creatinine, Ser: 1.14 mg/dL — ABNORMAL HIGH (ref 0.50–1.10)
GFR calc Af Amer: 50 mL/min — ABNORMAL LOW (ref >90)
Glucose, Bld: 119 mg/dL — ABNORMAL HIGH (ref 70–99)
Glucose, Bld: 77 mg/dL (ref 70–99)
Potassium: 3.7 mEq/L (ref 3.5–5.1)
Sodium: 141 mEq/L (ref 135–145)

## 2013-07-18 LAB — GLUCOSE, CAPILLARY
Glucose-Capillary: 94 mg/dL (ref 70–99)
Glucose-Capillary: 206 mg/dL — ABNORMAL HIGH (ref 70–99)
Glucose-Capillary: 105 mg/dL — ABNORMAL HIGH (ref 70–99)
Glucose-Capillary: 100 mg/dL — ABNORMAL HIGH (ref 70–99)

## 2013-07-18 MED ORDER — PIPERACILLIN-TAZOBACTAM IN DEX 2-0.25 GM/50ML IV SOLN
2.2500 g | Freq: Three times a day (TID) | INTRAVENOUS | Status: DC
  Administered 2013-07-19 (×3): 2.25 g via INTRAVENOUS
  Filled 2013-07-18 (×5): qty 50

## 2013-07-18 MED ORDER — VANCOMYCIN HCL 500 MG IV SOLR: 500.0000 mg | INTRAVENOUS | Status: DC

## 2013-07-18 MED ORDER — FUROSEMIDE 10 MG/ML IJ SOLN
40.0000 mg | Freq: Two times a day (BID) | INTRAMUSCULAR | Status: DC
  Administered 2013-07-18 (×2): 40 mg via INTRAVENOUS
  Filled 2013-07-18 (×2): qty 4

## 2013-07-18 NOTE — Progress Notes (Signed)
ANTIBIOTIC CONSULT NOTE - follow up   Pharmacy Consult for Vancocin and Zosyn Indication: fever of unknown source  No Known Allergies  Patient Measurements: Weight: 40 kg  Vital Signs: Temp: 98 F (36.7 C) (07/21 2048) Temp src: Oral (07/21 2048) BP: 146/79 mmHg (07/21 2048) Pulse Rate: 73 (07/21 2048)  Labs:  Recent Labs  07/16/13 2020 07/17/13 0211 07/18/13 0500 07/18/13 1740  WBC 7.2 7.2  --   --   HGB 11.0* 11.3*  --   --   PLT 198 173  --   --   CREATININE 0.97 1.05 1.14* 1.37*    Microbiology: Recent Results (from the past 720 hour(s))  URINE CULTURE     Status: None   Collection Time    07/16/13 10:00 PM      Result Value Range Status   Specimen Description URINE, CLEAN CATCH   Final   Special Requests ADDED 2227   Final   Culture  Setup Time 07/16/2013 23:27   Final   Colony Count 8,000 COLONIES/ML   Final   Culture INSIGNIFICANT GROWTH   Final   Report Status 07/18/2013 FINAL   Final  CULTURE, BLOOD (ROUTINE X 2)     Status: None   Collection Time    07/16/13 10:45 PM      Result Value Range Status   Specimen Description BLOOD RIGHT ARM   Final   Special Requests BOTTLES DRAWN AEROBIC ONLY 5CC   Final   Culture  Setup Time 07/17/2013 15:06   Final   Culture     Final   Value:        BLOOD CULTURE RECEIVED NO GROWTH TO DATE CULTURE WILL BE HELD FOR 5 DAYS BEFORE ISSUING A FINAL NEGATIVE REPORT   Report Status PENDING   Incomplete  CULTURE, BLOOD (ROUTINE X 2)     Status: None   Collection Time    07/16/13 10:54 PM      Result Value Range Status   Specimen Description BLOOD LEFT ARM   Final   Special Requests BOTTLES DRAWN AEROBIC ONLY 5CC   Final   Culture  Setup Time 07/17/2013 15:07   Final   Culture     Final   Value:        BLOOD CULTURE RECEIVED NO GROWTH TO DATE CULTURE WILL BE HELD FOR 5 DAYS BEFORE ISSUING A FINAL NEGATIVE REPORT   Report Status PENDING   Incomplete  MRSA PCR SCREENING     Status: None   Collection Time    07/17/13   1:16 AM      Result Value Range Status   MRSA by PCR NEGATIVE  NEGATIVE Final   Comment:            The GeneXpert MRSA Assay (FDA     approved for NASAL specimens     only), is one component of a     comprehensive MRSA colonization     surveillance program. It is not     intended to diagnose MRSA     infection nor to guide or     monitor treatment for     MRSA infections.    Medical History: Past Medical History  Diagnosis Date  . Osteoporosis   . Osteoarthritis   . Hypercholesteremia   . Orthopedic aftercare for healing traumatic leg fracture   . Rib fracture   . Osteoarthritis   . Spinal stenosis   . DEMENTIA     Assessment: Scr has increased  to 1.37 in this 79yo female currently on IV vancomycin and zosyn for fever of unknown source and SOB. Estimated CrCl is 19 ml/min which is borderline for q24h vs q48h dosing of IV vancomycin.  I/O  1330/875 yesterday. UOP today =1365 ml.   Will proceed with a vancomycin dose as scheduled tonight then adjust vancomycin to q48hr.  Will adjust zosyn dose for CrCl <20 ml/min.   MD notes today that breathing is better. She is afebrile.    Goal of Therapy:  Vancomycin trough level 15-20 mcg/ml  Plan:  Give vancomycin 500mg  IV q24h as scheduled tonight x1 then change dosage to 500 mg IV q48h.  Change Zosyn dose to 2.25 g IV Q8H.  Monitor CBC, Cx, levels prn and renal function.   Noah Delaine, RPh Clinical Pharmacist Pager: [phone] 07/18/2013,10:19 PM

## 2013-07-18 NOTE — Progress Notes (Signed)
Subjective: Patient appears much better today. No difficulty breathing. Leg pains have reduced. Family not present. Talked to family member- daughter over the phone, said px is a bit hard to understand because she has no teeth, mild confusion on a few occasions. Eats very soft meals, like mash potatoes, spinach, all cut into small bits. Sob started about 2 hrs after her last meal, daughter doesn't think patient aspirated, as mother had just climbed a flight of stairs, when the SOB started, and gradually got worse by the next day. Uses a walker and ambulates on her own, [facility] patients physical chart.  Objective: Vital signs in last 24 hours: Filed Vitals:   07/18/13 0005 07/18/13 0400 07/18/13 0800 07/18/13 1200  BP: 123/76 126/72 136/66 115/73  Pulse: 64 68 62 75  Temp: 98.3 F (36.8 C) 98.2 F (36.8 C) 98.3 F (36.8 C) 98.4 F (36.9 C)  TempSrc: Oral Oral Oral Oral  Resp: 17 22 18 19   Height:      Weight:  88 lb 2.9 oz (40 kg)    SpO2: 99% 98% 100% 99%   Weight change: -3.5 oz (-0.1 kg)  Intake/Output Summary (Last 24 hours) at 07/18/13 1417 Last data filed at 07/18/13 1058  Gross per 24 hour  Intake    566 ml  Output   1015 ml  Net   -449 ml   General appearance: alert, cooperative and appears stated age Head: Normocephalic, without obvious abnormality, atraumatic Lungs: mild to auscultation bilaterally Heart: regular rate and rhythm, S1, S2 normal, no murmur, click, rub or gallop Abdomen: soft, non-tender; bowel sounds normal; no masses,  no organomegaly Extremities: extremities normal, atraumatic, no cyanosis or edema.  Lab Results: Basic Metabolic Panel:  Recent Labs Lab 07/16/13 2020 07/17/13 0211 07/18/13 0500  NA 139  --  140  K 4.5  --  3.9  CL 103  --  100  CO2 26  --  26  GLUCOSE 142*  --  119*  BUN 25*  --  23  CREATININE 0.97 1.05 1.14*  CALCIUM 9.2  --  8.7  MG  --   --  1.8   CBC:  Recent  Labs Lab 07/16/13 2020 07/17/13 0211  WBC 7.2 7.2  NEUTROABS 5.8  --   HGB 11.0* 11.3*  HCT 34.3* 34.6*  MCV 86.2 85.9  PLT 198 173   Cardiac Enzymes:  Recent Labs Lab 07/17/13 0211 07/17/13 0925 07/17/13 1450  TROPONINI <0.30 <0.30 <0.30   BNP:  Recent Labs Lab 07/16/13 2305  PROBNP 1069.0*   D-Dimer:  Recent Labs Lab 07/16/13 2020  DDIMER 1.06*   CBG:  Recent Labs Lab 07/17/13 0914 07/17/13 1148 07/17/13 1711 07/17/13 2213 07/18/13 0816 07/18/13 1246  GLUCAP 140* 105* 83 94 88 206*   Hemoglobin A1C:  Recent Labs Lab 07/17/13 0211  HGBA1C 6.0*   Urinalysis:  Recent Labs Lab 07/16/13 2200 07/17/13 1625  COLORURINE STRAW* YELLOW  LABSPEC 1.009 1.024  PHURINE 7.0 6.0  GLUCOSEU NEGATIVE NEGATIVE  HGBUR SMALL* SMALL*  BILIRUBINUR NEGATIVE NEGATIVE  KETONESUR NEGATIVE NEGATIVE  PROTEINUR NEGATIVE NEGATIVE  UROBILINOGEN 0.2 0.2  NITRITE NEGATIVE NEGATIVE  LEUKOCYTESUR MODERATE* TRACE*   Micro Results: Recent Results (from the past 240 hour(s))  URINE CULTURE     Status: None   Collection Time    07/16/13 10:00 PM      Result Value Range Status   Specimen Description URINE,  CLEAN CATCH   Final   Special Requests ADDED 2227   Final   Culture  Setup Time 07/16/2013 23:27   Final   Colony Count 8,000 COLONIES/ML   Final   Culture INSIGNIFICANT GROWTH   Final   Report Status 07/18/2013 FINAL   Final  CULTURE, BLOOD (ROUTINE X 2)     Status: None   Collection Time    07/16/13 10:45 PM      Result Value Range Status   Specimen Description BLOOD RIGHT ARM   Final   Special Requests BOTTLES DRAWN AEROBIC ONLY 5CC   Final   Culture  Setup Time 07/17/2013 15:06   Final   Culture     Final   Value:        BLOOD CULTURE RECEIVED NO GROWTH TO DATE CULTURE WILL BE HELD FOR 5 DAYS BEFORE ISSUING A FINAL NEGATIVE REPORT   Report Status PENDING   Incomplete  CULTURE, BLOOD (ROUTINE X 2)     Status: None   Collection Time    07/16/13 10:54 PM       Result Value Range Status   Specimen Description BLOOD LEFT ARM   Final   Special Requests BOTTLES DRAWN AEROBIC ONLY 5CC   Final   Culture  Setup Time 07/17/2013 15:07   Final   Culture     Final   Value:        BLOOD CULTURE RECEIVED NO GROWTH TO DATE CULTURE WILL BE HELD FOR 5 DAYS BEFORE ISSUING A FINAL NEGATIVE REPORT   Report Status PENDING   Incomplete  MRSA PCR SCREENING     Status: None   Collection Time    07/17/13  1:16 AM      Result Value Range Status   MRSA by PCR NEGATIVE  NEGATIVE Final   Comment:            The GeneXpert MRSA Assay (FDA     approved for NASAL specimens     only), is one component of a     comprehensive MRSA colonization     surveillance program. It is not     intended to diagnose MRSA     infection nor to guide or     monitor treatment for     MRSA infections.   Studies/Results: Dg Chest 2 View  07/18/2013   *RADIOLOGY REPORT*  Clinical Data: Shortness of breath.  CHEST - 2 VIEW  Comparison: 07/16/2013.  Findings: The cardiac silhouette, mediastinal and hilar contours are stable.  There is tortuosity and ectasia of the thoracic aorta. Chronic bronchitic type interstitial lung disease and probable pulmonary fibrosis. Improved aeration when compared to prior films suggesting resolving overlying interstitial pulmonary edema.  No pneumothorax.  IMPRESSION: Chronic interstitial lung disease with resolving pulmonary edema.   Original Report Authenticated By: Rudie Meyer, M.D.   Dg Chest 2 View  07/16/2013   *RADIOLOGY REPORT*  Clinical Data: CHF versus pneumonia  CHEST - 2 VIEW  Comparison: Prior radiograph performed earlier on the same day 1954  Findings: Cardiac and mediastinal silhouettes are unchanged as compared to the prior examination.  Diffuse prominence of the interstitial markings is again seen, slightly improved as compared to the prior examination, most consistent with improved edema.  No new focal consolidation to suggest pneumonia identified. No  pneumothorax.  No definite pleural effusion.  IMPRESSION: Slight interval improvement in diffuse prominence of the interstitial markings, most consistent with persistent but improved pulmonary edema.  No airspace focal infiltrates  to suggest consolidative pneumonia identified.   Original Report Authenticated By: Rise Mu, M.D.   Dg Ankle 2 Views Left  07/17/2013   *RADIOLOGY REPORT*  Clinical Data: Left ankle pain after injury.  LEFT ANKLE - 2 VIEW  Comparison: None.  Findings: Imaged bones, joints and soft tissues appear normal.  IMPRESSION: Negative exam.   Original Report Authenticated By: Holley Dexter, M.D.   Dg Ankle 2 Views Right  07/17/2013   *RADIOLOGY REPORT*  Clinical Data: Right foot pain.  RIGHT ANKLE - 2 VIEW  Comparison: None.  Findings: No acute bony or joint abnormality is identified.  IM nail the distal tibia with two interlocking screws is identified. Bones appear osteopenic. Soft tissues are unremarkable.  IMPRESSION:  1.  No acute abnormality. 2.  Osteopenia. 3.  Partial visualization of an IM nail in the right tibia.   Original Report Authenticated By: Holley Dexter, M.D.   Dg Chest Port 1 View  07/16/2013   *RADIOLOGY REPORT*  Clinical Data: Shortness of breath, nonsmoker  PORTABLE CHEST - 1 VIEW  Comparison: 08/11/2012  Findings: There is diffuse prominence of the interstitial markings with a few Kerley B lines and mild cardiomegaly.  Trace dependent pleural fluid noted.  No pneumothorax.  No focal pulmonary opacity.  IMPRESSION: Constellation of findings suggesting mild interstitial edema. If the patient's symptoms continue, consider PA and lateral chest radiographs obtained at full inspiration when the patient is clinically able.   Original Report Authenticated By: Christiana Pellant, M.D.   Dg Foot 2 Views Left  07/17/2013   *RADIOLOGY REPORT*  Clinical Data: Left foot pain, injury.  LEFT FOOT - 2 VIEW  Comparison: None.  Findings: No acute bony or joint  abnormality is identified.  Soft tissue structures are unremarkable.  IMPRESSION: No acute finding.   Original Report Authenticated By: Holley Dexter, M.D.   Dg Foot 2 Views Right  07/17/2013   *RADIOLOGY REPORT*  Clinical Data: Right foot pain.  RIGHT FOOT - 2 VIEW  Comparison: None.  Findings: No acute bony or joint abnormality is identified.  Bones are osteopenic.  Hallux valgus deformity is noted. Soft tissue structures are unremarkable.  IMPRESSION:  1.  No acute finding. 2.  Osteopenia. 3.  Hallux valgus.   Original Report Authenticated By: Holley Dexter, M.D.   Medications: I have reviewed the patient's current medications. Scheduled Meds: . aspirin  81 mg Oral Daily  . calcium-vitamin D  1 tablet Oral Daily  . docusate sodium  100 mg Oral 3 times weekly  . donepezil  10 mg Oral QHS  . enoxaparin (LOVENOX) injection  20 mg Subcutaneous Q24H  . furosemide  40 mg Intravenous BID  . HYDROcodone-acetaminophen  1 tablet Oral TID  . multivitamin with minerals  1 tablet Oral Daily  . piperacillin-tazobactam (ZOSYN)  IV  3.375 g Intravenous Q8H  . sodium chloride  3 mL Intravenous Q12H  . sodium chloride  3 mL Intravenous Q12H  . vancomycin  500 mg Intravenous Q24H   Continuous Infusions:  PRN Meds:.sodium chloride, albuterol, ipratropium, sodium chloride Assessment/Plan: Principal Problem:   Acute respiratory failure with hypoxia Active Problems:   Anemia   Alzheimer's disease   Spinal stenosis   Fever   Chronic diastolic CHF (congestive heart failure)  # Acute Respiratory failure with hypoxia, possibly Health care associated pneumonia- In the light of her Hx of cough with productive sputum, SOB and fever. 2nd day On Pip-Tazo and Vanc. Currently on Frusemide-IV 40mg  BID- with significant improvement.  repeat chest xray shows some improvement over Chest xray on presentation. Spo2- current 97% on room air.  -wean off O2. -Chronic diastolic heart failure is also a likely  aetiology, considering hx of dCHF, chest xray findings, clinical improvement on Lasix.  Since chest xray is not particularly suggestive of infection, investigation to look for other source of infection-  -Repeat Urinalysis- no concerning abnormalities. - Await blood culture results.  - repeat Xray tomorrow.  -Iv frusemide- 40 mg BID.  -Physical therapy. - Speech eval to R/o swallowing difficulty aspiration as cause of SOB, as px as underlying dementia, and has no teeth. # Leg Pain- Complaints of Rt leg pain today, and later information that [patient complained of Lt leg pain also.  - Therefore Xrays of Left and right ankle and foot.  -Venous doppler to R/o DVT, as patient D.Dimer is elevated-1.06 , and px also has SOB.  # Spinal stenosis-chronic, complains of constant RUE pain. On home regimen of Vicodin 5-325mg  1 tablet TID.  -resume home medications   # Anemia-Hb on admission 11 with MCV 86.2. Appears chronic since 2011.  [facility]  - No interventions at this time. # Dementia-hx of Alzheimer's disease. On Aricept at home. Able to appropriately answer questions and alert and oriented x3 at time of admission.  -continue home dose Aricept  #DVT PPX- Enoxaparin.  Dispo: Disposition is deferred at this time, awaiting improvement of current medical problems.  Anticipated discharge in approximately 1-2 day(s).   .Services Needed at time of discharge: Y = Yes, Blank = No PT:   OT:   RN:   Equipment:   Other:     LOS: 2 days   Kennis Carina, MD 07/18/2013, 2:17 PM

## 2013-07-18 NOTE — Progress Notes (Signed)
VASCULAR LAB PRELIMINARY  PRELIMINARY  PRELIMINARY  PRELIMINARY  Bilateral lower extremity venous duplex  completed.    Preliminary report:  Bilateral:  No evidence of DVT, superficial thrombosis, or Baker's Cyst.    Leily Capek, RVT 07/18/2013, 9:26 AM

## 2013-07-18 NOTE — Progress Notes (Addendum)
1530:  Nurse contacted IV team to ensure pt remained on list for access, IV team confirmed pt remains on list and will be assessed soon as possible.  MD is aware  1430:  Nurse unable to administer 1400 scheduled Zosyn dose during 1400 hour due to pt no longer having IV access.  Nurse attempted access 2 times, IV nurse attempted IV access 2 times.  Another resource from IV team will attempt to gain access.

## 2013-07-18 NOTE — Progress Notes (Signed)
  Date: 07/18/2013  Patient name: Mary Hartman  Medical record number: [id number]  Date of birth: Jun 28, 1928   This patient has been seen and the plan of care was discussed with the house staff. Please see their note for complete details. I concur with their findings with the following additions/corrections:  Afebrile. Breathing seems to be better. No reports of productive cough. Repeat CXR seems slightly improved. For now, I would continue Vanc and Zosyn and likely D/C tomorrow if no + culture data.  Continue diuresis one more day with Lasix 40 mg IV bid. Repeat TTE reviewed, she has HFpEF.  Discuss with daughter baseline mental status. Jonah Blue, DO 07/18/2013, 1:58 PM

## 2013-07-18 NOTE — Progress Notes (Signed)
Nutrition Brief Note  Patient identified on the Malnutrition Screening Tool (MST) Report for recent weight loss without trying.  Patient has had a 9% weight loss in over 1 year; not significant for time frame.  Wt Readings from Last 10 Encounters:  07/18/13 88 lb 2.9 oz (40 kg)  03/02/12 97 lb 10.6 oz (44.3 kg)  02/07/12 104 lb (47.174 kg)    Body mass index is 19.02 kg/(m^2). Patient meets criteria for Normal based on current BMI.   Current diet order is Heart Healthy, patient is consuming approximately 50% of meals at this time. Labs and medications reviewed.   No nutrition interventions warranted at this time. If nutrition issues arise, please consult RD.   Maureen Chatters, RD, LDN Pager #: [phone] After-Hours Pager #: [phone]

## 2013-07-19 ENCOUNTER — Inpatient Hospital Stay (HOSPITAL_COMMUNITY): Payer: Medicare (Managed Care)

## 2013-07-19 LAB — BASIC METABOLIC PANEL
Calcium: 8.7 mg/dL (ref 8.4–10.5)
GFR calc Af Amer: 46 mL/min — ABNORMAL LOW (ref >90)
GFR calc non Af Amer: 40 mL/min — ABNORMAL LOW (ref >90)
Sodium: 137 mEq/L (ref 135–145)

## 2013-07-19 LAB — MAGNESIUM: Magnesium: 1.9 mg/dL (ref 1.5–2.5)

## 2013-07-19 LAB — CLOSTRIDIUM DIFFICILE BY PCR: Toxigenic C. Difficile by PCR: NEGATIVE

## 2013-07-19 MED ORDER — TECHNETIUM TO 99M ALBUMIN AGGREGATED
6.0000 | Freq: Once | INTRAVENOUS | Status: AC | PRN
  Administered 2013-07-19: 6 via INTRAVENOUS

## 2013-07-19 MED ORDER — TECHNETIUM TC 99M DIETHYLENETRIAME-PENTAACETIC ACID: 40.0000 | Freq: Once | INTRAVENOUS | Status: AC | PRN

## 2013-07-19 NOTE — Progress Notes (Signed)
  Date: 07/19/2013  Patient name: Mary Hartman  Medical record number: [id number]  Date of birth: 1928/05/12   This patient has been seen and the plan of care was discussed with the house staff. Please see their note for complete details. I concur with their findings with the following additions/corrections:  RN reported O2 sats dropped to mid 50% range with ambulation as well as below 90% at rest this morning. Patient states she feels better. She is developing a contraction alkalosis. No more fevers. At this time, I would pursue a V/Q scan. She is developing volume contraction with concern for AKI, so I would stop diuresis at this time. Given the lack of cough, fever, and leukocytosis as well as improving CXR, I don't think we need to continue treatment for HCAP. Appreciate SLP eval, not likely to have aspiration causing pulmonary symptoms.  Jonah Blue, DO 07/19/2013, 2:18 PM

## 2013-07-19 NOTE — Progress Notes (Signed)
Pt continues to have multiple loose stools: some dietery modifications in place to avoid fruit and juices. Pt feeds herself with good intake. 2L n/c necessary during meals, and ADLs.

## 2013-07-19 NOTE — Care Management Note (Addendum)
    Page 1 of 2   07/20/2013     2:59:08 PM   CARE MANAGEMENT NOTE 07/20/2013  Patient:  Mary Hartman,Mary Hartman   Account Number:  [id number]  Date Initiated:  07/19/2013  Documentation initiated by:  Verdis Prime  Subjective/Objective Assessment:   79 yr-old female adm with dx of resp failure; lives with dtr, enrolled in [facility]     In-house referral  Clinical Social Worker      DC Associate Professor  CM consult      DME arranged  [facility]      DME agency  OTHER - SEE NOTE    HH arranged  HH-1 RN  HH-2 PT      HH agency  OTHER - SEE NOTE   Status of service:  Completed, signed off  Discharge Disposition:  HOME W HOME HEALTH SERVICES  Per UR Regulation:  Reviewed for med. necessity/level of care/duration of stay  Comments:  PCP: Dr Marny Lowenstein  Contact:  Dorena Dew, dtr  [phone]  07/20/13 1451 Verity Gilcrest RN MSN BSN CCM Met with dtr @ bedside, talked with [facility] SW and RN.  Plan is d/c home with additional CNA hours @ home, [facility] RN and PT,  and home O2 supplied by [facility].  07/19/13 1442 Kamylle Axelson RN MSN BSN CCM PT recommends SNF if pt does not have 24/7 assistance @ home - home health PT if she does.  TC to dtr who states she thinks ST-SNF for rehab will be the best option for pt as she doesn't have 24/7 assist, requests TC to pt's [facility] social worker, Leighton Ruff @ [phone].  Marylu Lund states that [facility] RN will eval pt - pt can stay @ [facility] during the day while dtr works and she can receive physical therapy there. If pt needs xfer to SNF, [facility] is contracted with [facility]. CSW notified of possible placement need.

## 2013-07-19 NOTE — Progress Notes (Signed)
Utilization Review Completed.  

## 2013-07-19 NOTE — Evaluation (Signed)
Physical Therapy Evaluation Patient Details Name: Mary Hartman MRN: [id number] DOB: September 01, 1928 Today's Date: 07/19/2013 Time: 5784-6962 PT Time Calculation (min): 36 min  PT Assessment / Plan / Recommendation History of Present Illness  Pt admitted for SOB. Pt lives with daughter.  Clinical Impression  Pt with noted SOB with activity and significant falls risk. Unclear if 24/7 assist is avail upon pt d/c. Pt will need 24/7 supervision/assist for safe d/c home. If 24/7 assist not avail pt will need ST-SNF placement. Attempted to amb x2 however pt with stool incontinence. Pt with significantly delayed processing/problem solving requiring mod/maxA for safe ambulation with RW. Acute PT to con't to follow to maximize functional recovery prior to d/c.    PT Assessment  Patient needs continued PT services    Follow Up Recommendations  Home health PT;Supervision/Assistance - 24 hour    Does the patient have the potential to tolerate intense rehabilitation      Barriers to Discharge   pt questionable historian, pt may/may not have 24/7 assist    Equipment Recommendations  None recommended by PT    Recommendations for Other Services     Frequency Min 3X/week    Precautions / Restrictions Precautions Precautions: Fall Restrictions Weight Bearing Restrictions: No   Pertinent Vitals/Pain SATURATION QUALIFICATIONS: (This note is used to comply with regulatory documentation for home oxygen)  Patient Saturations on Room Air at Rest = 92%  Patient Saturations on Room Air while Ambulating = 64%  Patient Saturations on 2 Liters of oxygen while Ambulating = 93%  Please briefly explain why patient needs home oxygen:      Mobility  Bed Mobility Bed Mobility: Supine to Sit Supine to Sit: 4: Min guard;With rails;HOB flat Details for Bed Mobility Assistance: use of rail to pull self to EOB Transfers Transfers: Sit to Stand;Stand to Sit Sit to Stand: 3: Mod assist;With upper extremity  assist;From bed Stand to Sit: 3: Mod assist;With upper extremity assist;To chair/3-in-1;To bed Details for Transfer Assistance: max v/c's for safe hand placement, + post lean and bilat knee ext, v/c's to bring feet beneath her Ambulation/Gait Ambulation/Gait Assistance: 2: Max assist Ambulation Distance (Feet): 5 Feet Assistive device: Rolling walker Ambulation/Gait Assistance Details: limited by incontinence of stool Gait Pattern: Step-to pattern;Decreased stride length;Shuffle Gait velocity: slow General Gait Details: post lean, bilat knee ext, max v/c's to flex knee during swing phase, pt at extremely high falls risk Stairs: No    Exercises     PT Diagnosis: Difficulty walking;Generalized weakness  PT Problem List: Decreased strength;Decreased activity tolerance;Decreased balance;Decreased mobility PT Treatment Interventions: DME instruction;Gait training;Stair training;Functional mobility training;Therapeutic activities;Therapeutic exercise;Balance training     PT Goals(Current goals can be found in the care plan section) Acute Rehab PT Goals PT Goal Formulation: With patient Time For Goal Achievement: 07/26/13 Potential to Achieve Goals: Fair  Visit Information  Last PT Received On: 07/19/13 Assistance Needed: +1 (2nd person nice for lines) History of Present Illness: Pt admitted for SOB. Pt lives with daughter.       Prior Functioning  Home Living Family/patient expects to be discharged to:: Private residence Living Arrangements: Children Available Help at Discharge: Family;Friend(s);Personal care attendant;Available 24 hours/day Type of Home: House Home Access: Level entry Home Layout: Two level Alternate Level Stairs-Number of Steps: 12 Alternate Level Stairs-Rails: Right Home Equipment: Walker - 2 wheels;Wheelchair - manual;Cane - single point;Shower seat Additional Comments: pt is questionable historian- family unavailable to confirm Prior Function Level of  Independence: Needs assistance Gait / Transfers  Assistance Needed: pt uses RW ADL's / Homemaking Assistance Needed: pt reports help for dressing, bathing, and making food Comments: pt reports having a homemaker help her Communication Communication: HOH Dominant Hand: Right    Cognition  Cognition Arousal/Alertness: Awake/alert Behavior During Therapy: WFL for tasks assessed/performed Overall Cognitive Status: Within Functional Limits for tasks assessed    Extremity/Trunk Assessment Upper Extremity Assessment Upper Extremity Assessment: Generalized weakness Lower Extremity Assessment Lower Extremity Assessment: Generalized weakness Cervical / Trunk Assessment Cervical / Trunk Assessment: Kyphotic (large soft lump on posterior L flank)   Balance Balance Balance Assessed: Yes Static Standing Balance Static Standing - Balance Support: Bilateral upper extremity supported Static Standing - Level of Assistance: 3: Mod assist Static Standing - Comment/# of Minutes: pt stood x 2 min for hygiene, max v/c's to maintain upright position and achieve mid line  End of Session PT - End of Session Equipment Utilized During Treatment: Gait belt Activity Tolerance: Patient limited by fatigue (and stool incontinence) Patient left: in chair;with call bell/phone within reach Nurse Communication: Mobility status (RN present to assist with hygiene s/p BM)  GP     Leigh Blas Marie 07/19/2013, 11:11 AM  Lewis Shock, PT, DPT Pager #: [phone] Office #: [phone]

## 2013-07-19 NOTE — Evaluation (Signed)
Clinical/Bedside Swallow Evaluation Patient Details  Name: Mary Hartman MRN: [id number] Date of Birth: 03-Feb-1928  Today's Date: 07/19/2013 Time: 1110-1130 SLP Time Calculation (min): 20 min  Past Medical History:  Past Medical History  Diagnosis Date  . Osteoporosis   . Osteoarthritis   . Hypercholesteremia   . Orthopedic aftercare for healing traumatic leg fracture   . Rib fracture   . Osteoarthritis   . Spinal stenosis   . DEMENTIA    Past Surgical History:  Past Surgical History  Procedure Laterality Date  . Hernia repair    . Orthopedic surgery     HPI:  79 y.o. female with hx of dementia admitted with acute respiratory failure with hypoxia.  SLP eval ordered to R/o swallowing difficulty aspiration as cause of SOB.  CXR: slight interval improvement in diffuse prominence of the interstitial markings, most consistent with persistent but improved pulmonary edema.  No airspace focal infiltrates to suggest consolidative pneumonia identified.  CT cervical spine 2011 revealed osteophytes C4-5 and kyphosis.   Assessment / Plan / Recommendation Clinical Impression  Pt presents with functional oropharyngeal swallow marked by adequate mastication (prolonged, but functional), swift swallow trigger, and no signs of penetration/aspiration.  RR around 18, compatible with swallowing/ventilatory coordination.  It is unlikely that swallowing impairment is contributing to her respiratory status.  REC continued Dysphagia 1 (C/W diet PTA), thin liquids.  Meds whole with liquid.  SLP to sign off.           Diet Recommendation Dysphagia 1 (Puree);Thin liquid   Liquid Administration via: Cup;Straw Medication Administration: Whole meds with liquid Supervision: Patient able to self feed    Other  Recommendations Oral Care Recommendations: Oral care BID   Follow Up Recommendations  None     Swallow Study Prior Functional Status  Type of Home: House Available Help at Discharge:  Family;Friend(s);Personal care attendant;Available 24 hours/day    General Date of Onset: 07/16/13 HPI: 79 y.o. female with hx of dementia admitted with acute respiratory failure with hypoxia.  SLP eval ordered to R/o swallowing difficulty aspiration as cause of SOB.  CXR: slight interval improvement in diffuse prominence of the interstitial markings, most consistent with persistent but improved pulmonary edema.  No airspace focal infiltrates to suggest consolidative pneumonia identified.  CT cervical spine 2011 revealed osteophytes C4-5 and kyphosis. Type of Study: Bedside swallow evaluation Previous Swallow Assessment: none per records Diet Prior to this Study: Dysphagia 1 (puree);Thin liquids Temperature Spikes Noted: No Respiratory Status: Supplemental O2 delivered via (comment) (nasal cannula) Behavior/Cognition: Alert;Cooperative;Pleasant mood Oral Cavity - Dentition: Edentulous Self-Feeding Abilities: Able to feed self Patient Positioning: Upright in chair Baseline Vocal Quality: Clear Volitional Cough: Strong Volitional Swallow: Able to elicit    Oral/Motor/Sensory Function Overall Oral Motor/Sensory Function: Appears within functional limits for tasks assessed   Ice Chips Ice chips: Within functional limits   Thin Liquid Thin Liquid: Within functional limits Presentation: Cup;Self Fed    Nectar Thick Nectar Thick Liquid: Not tested   Honey Thick Honey Thick Liquid: Not tested   Puree Puree: Within functional limits Presentation: Self Fed;Spoon   Solid   Catina Nuss L. [place], [place] CCC/SLP Pager [phone]     Solid: Not tested (pt only eats soft/pureed foods at home per chart)       Blenda Mounts Laurice 07/19/2013,11:39 AM

## 2013-07-19 NOTE — Progress Notes (Signed)
Subjective: Patient reports she feels much better. Grand daughter present. Complaints of shoulder pain today, which is a chronic complaints, leg pain has reduced.  Objective: Vital signs in last 24 hours: Filed Vitals:   07/18/13 2048 07/18/13 2329 07/19/13 0419 07/19/13 0819  BP: 146/79 126/86 112/61 128/66  Pulse: 73 72 56 57  Temp: 98 F (36.7 C) 98 F (36.7 C) 97.9 F (36.6 C) 98 F (36.7 C)  TempSrc: Oral Oral Oral Oral  Resp: 18 20 13 12   Height:      Weight:   86 lb 6.7 oz (39.2 kg)   SpO2: 95% 95% 100% 100%   Weight change: -1 lb 12.2 oz (-0.8 kg)  Intake/Output Summary (Last 24 hours) at 07/19/13 0849 Last data filed at 07/19/13 1610  Gross per 24 hour  Intake    106 ml  Output   1505 ml  Net  -1399 ml   General appearance: alert, cooperative, appears stated age and no distress Head: Normocephalic, without obvious abnormality, atraumatic Lungs: clear to auscultation bilaterally Heart: regular rate and rhythm, S1, S2 normal, no murmur, click, rub or gallop Abdomen: soft, non-tender; bowel sounds normal; no masses,  no organomegaly Pulses: 2+ and symmetric Lab Results: Basic Metabolic Panel:  Recent Labs Lab 07/18/13 0500 07/18/13 1740  NA 140 141  K 3.9 3.7  CL 100 100  CO2 26 30  GLUCOSE 119* 77  BUN 23 23  CREATININE 1.14* 1.37*  CALCIUM 8.7 8.8  MG 1.8 2.0   CBC:  Recent Labs Lab 07/16/13 2020 07/17/13 0211  WBC 7.2 7.2  NEUTROABS 5.8  --   HGB 11.0* 11.3*  HCT 34.3* 34.6*  MCV 86.2 85.9  PLT 198 173   Cardiac Enzymes:  Recent Labs Lab 07/17/13 0211 07/17/13 0925 07/17/13 1450  TROPONINI <0.30 <0.30 <0.30   BNP:  Recent Labs Lab 07/16/13 2305  PROBNP 1069.0*   D-Dimer:  Recent Labs Lab 07/16/13 2020  DDIMER 1.06*   CBG:  Recent Labs Lab 07/17/13 2213 07/18/13 0816 07/18/13 1246 07/18/13 1633 07/18/13 2046 07/19/13 0808  GLUCAP 94 88 206* 105* 100* 100*   Hemoglobin A1C:  Recent Labs Lab  07/17/13 0211  HGBA1C 6.0*   Urinalysis:  Recent Labs Lab 07/16/13 2200 07/17/13 1625  COLORURINE STRAW* YELLOW  LABSPEC 1.009 1.024  PHURINE 7.0 6.0  GLUCOSEU NEGATIVE NEGATIVE  HGBUR SMALL* SMALL*  BILIRUBINUR NEGATIVE NEGATIVE  KETONESUR NEGATIVE NEGATIVE  PROTEINUR NEGATIVE NEGATIVE  UROBILINOGEN 0.2 0.2  NITRITE NEGATIVE NEGATIVE  LEUKOCYTESUR MODERATE* TRACE*    Micro Results: Recent Results (from the past 240 hour(s))  URINE CULTURE     Status: None   Collection Time    07/16/13 10:00 PM      Result Value Range Status   Specimen Description URINE, CLEAN CATCH   Final   Special Requests ADDED 2227   Final   Culture  Setup Time 07/16/2013 23:27   Final   Colony Count 8,000 COLONIES/ML   Final   Culture INSIGNIFICANT GROWTH   Final   Report Status 07/18/2013 FINAL   Final  CULTURE, BLOOD (ROUTINE X 2)     Status: None   Collection Time    07/16/13 10:45 PM      Result Value Range Status   Specimen Description BLOOD RIGHT ARM   Final   Special Requests BOTTLES DRAWN AEROBIC ONLY 5CC   Final   Culture  Setup Time 07/17/2013 15:06   Final   Culture  Final   Value:        BLOOD CULTURE RECEIVED NO GROWTH TO DATE CULTURE WILL BE HELD FOR 5 DAYS BEFORE ISSUING A FINAL NEGATIVE REPORT   Report Status PENDING   Incomplete  CULTURE, BLOOD (ROUTINE X 2)     Status: None   Collection Time    07/16/13 10:54 PM      Result Value Range Status   Specimen Description BLOOD LEFT ARM   Final   Special Requests BOTTLES DRAWN AEROBIC ONLY 5CC   Final   Culture  Setup Time 07/17/2013 15:07   Final   Culture     Final   Value:        BLOOD CULTURE RECEIVED NO GROWTH TO DATE CULTURE WILL BE HELD FOR 5 DAYS BEFORE ISSUING A FINAL NEGATIVE REPORT   Report Status PENDING   Incomplete  MRSA PCR SCREENING     Status: None   Collection Time    07/17/13  1:16 AM      Result Value Range Status   MRSA by PCR NEGATIVE  NEGATIVE Final   Comment:            The GeneXpert MRSA Assay  (FDA     approved for NASAL specimens     only), is one component of a     comprehensive MRSA colonization     surveillance program. It is not     intended to diagnose MRSA     infection nor to guide or     monitor treatment for     MRSA infections.   Studies/Results: Dg Chest 2 View  07/18/2013   *RADIOLOGY REPORT*  Clinical Data: Shortness of breath.  CHEST - 2 VIEW  Comparison: 07/16/2013.  Findings: The cardiac silhouette, mediastinal and hilar contours are stable.  There is tortuosity and ectasia of the thoracic aorta. Chronic bronchitic type interstitial lung disease and probable pulmonary fibrosis. Improved aeration when compared to prior films suggesting resolving overlying interstitial pulmonary edema.  No pneumothorax.  IMPRESSION: Chronic interstitial lung disease with resolving pulmonary edema.   Original Report Authenticated By: Rudie Meyer, M.D.   Dg Ankle 2 Views Left  07/17/2013   *RADIOLOGY REPORT*  Clinical Data: Left ankle pain after injury.  LEFT ANKLE - 2 VIEW  Comparison: None.  Findings: Imaged bones, joints and soft tissues appear normal.  IMPRESSION: Negative exam.   Original Report Authenticated By: Holley Dexter, M.D.   Dg Ankle 2 Views Right  07/17/2013   *RADIOLOGY REPORT*  Clinical Data: Right foot pain.  RIGHT ANKLE - 2 VIEW  Comparison: None.  Findings: No acute bony or joint abnormality is identified.  IM nail the distal tibia with two interlocking screws is identified. Bones appear osteopenic. Soft tissues are unremarkable.  IMPRESSION:  1.  No acute abnormality. 2.  Osteopenia. 3.  Partial visualization of an IM nail in the right tibia.   Original Report Authenticated By: Holley Dexter, M.D.   Dg Foot 2 Views Left  07/17/2013   *RADIOLOGY REPORT*  Clinical Data: Left foot pain, injury.  LEFT FOOT - 2 VIEW  Comparison: None.  Findings: No acute bony or joint abnormality is identified.  Soft tissue structures are unremarkable.  IMPRESSION: No acute  finding.   Original Report Authenticated By: Holley Dexter, M.D.   Dg Foot 2 Views Right  07/17/2013   *RADIOLOGY REPORT*  Clinical Data: Right foot pain.  RIGHT FOOT - 2 VIEW  Comparison: None.  Findings: No acute bony  or joint abnormality is identified.  Bones are osteopenic.  Hallux valgus deformity is noted. Soft tissue structures are unremarkable.  IMPRESSION:  1.  No acute finding. 2.  Osteopenia. 3.  Hallux valgus.   Original Report Authenticated By: Holley Dexter, M.D.   Medications: I have reviewed the patient's current medications. Scheduled Meds: . aspirin  81 mg Oral Daily  . calcium-vitamin D  1 tablet Oral Daily  . docusate sodium  100 mg Oral 3 times weekly  . donepezil  10 mg Oral QHS  . enoxaparin (LOVENOX) injection  20 mg Subcutaneous Q24H  . HYDROcodone-acetaminophen  1 tablet Oral TID  . multivitamin with minerals  1 tablet Oral Daily  . piperacillin-tazobactam (ZOSYN)  IV  2.25 g Intravenous Q8H  . sodium chloride  3 mL Intravenous Q12H  . sodium chloride  3 mL Intravenous Q12H  . [START ON 07/20/2013] vancomycin  500 mg Intravenous Q48H   Continuous Infusions:  PRN Meds:.sodium chloride, albuterol, ipratropium, sodium chloride Assessment/Plan:  # Acute Respiratory failure with hypoxia, likely due to fluid overload, as patient has a hx of dCHF- last EF-07/17/2013- 65-70%, was placed on IV frusemide- 40mg BID, urine output in the last 24 hrs- 1565 . Patient developed contraction alkalosis, bump in Cr- 0.9 on admission to 1.22 -07/19/2013. Px was previously on IV frusemide 40mg  BID, to D/c. Health care associated pneumonia, is a likely aetiology, was treated with Pip-tazo and Vanc for 3 days, but clinical feats currently do not justify the continued use of IV antibiotics.  -D/c antibiotics. D/c Furosemide. - Speech pathologist eval appreciated- unlikely swallowing impairment is contributing to her respiratory status. -PT eval appreciated-  Patient Saturations on Room  Air at Rest = 92%  Patient Saturations on Room Air while Ambulating = 64%  Patient Saturations on 2 Liters of oxygen while Ambulating = 93%  - V/Q scan to access for PE as a cause of difficulty breathing.  - Await blood culture results- Prelim results- no growth.   # Leg Pain- Complaints of Rt leg pain today, and later information that [patient complained of Lt leg pain] - Therefore Xrays of Left and right ankle and foot- showed no concerning abn except for osteopenia. -Venous doppler were neg for DVT, was done to R/o DVt as a cause of leg pain,also patient D.Dimer was elevated-1.06 ,and px also had SOB.  # Spinal stenosis-chronic. On home regimen of Vicodin 5-325mg  1 tablet TID.  -resume home medications   # Anemia-Hb on admission 11 with MCV 86.2. Appears chronic since 2011.   - No interventions at this time.  # Dementia-hx of Alzheimer's disease. On Aricept at home. Able to appropriately answer questions and alert and oriented x3 at time of admission.  -continue home dose Aricept  #DVT PPX- Enoxaparin.  Dispo: Disposition is deferred at this time, awaiting improvement of current medical problems.     .Services Needed at time of discharge: Y = Yes, Blank = No PT:   OT:   RN:   Equipment:   Other:     LOS: 3 days   Kennis Carina, MD 07/19/2013, 8:49 AM

## 2013-07-20 ENCOUNTER — Inpatient Hospital Stay (HOSPITAL_COMMUNITY): Payer: Medicare (Managed Care)

## 2013-07-20 DIAGNOSIS — J9600 Acute respiratory failure, unspecified whether with hypoxia or hypercapnia: Secondary | ICD-10-CM

## 2013-07-20 DIAGNOSIS — R0902 Hypoxemia: Secondary | ICD-10-CM

## 2013-07-20 DIAGNOSIS — I509 Heart failure, unspecified: Secondary | ICD-10-CM

## 2013-07-20 LAB — BLOOD GAS, ARTERIAL
Acid-Base Excess: 2 mmol/L (ref 0.0–2.0)
Bicarbonate: 26.1 meq/L — ABNORMAL HIGH (ref 20.0–24.0)
Drawn by: 281201
O2 Content: 2 L/min
O2 Saturation: 98.4 %
Patient temperature: 98.6
TCO2: 27.3 mmol/L (ref 0–100)
pCO2 arterial: 40.7 mmHg (ref 35.0–45.0)
pH, Arterial: 7.422 (ref 7.350–7.450)
pO2, Arterial: 117 mmHg — ABNORMAL HIGH (ref 80.0–100.0)

## 2013-07-20 LAB — GLUCOSE, CAPILLARY
Glucose-Capillary: 87 mg/dL (ref 70–99)
Glucose-Capillary: 125 mg/dL — ABNORMAL HIGH (ref 70–99)
Glucose-Capillary: 94 mg/dL (ref 70–99)

## 2013-07-20 LAB — CBC WITH DIFFERENTIAL/PLATELET
Eosinophils Relative: 5 % (ref 0–5)
HCT: 36 % (ref 36.0–46.0)
Hemoglobin: 11.4 g/dL — ABNORMAL LOW (ref 12.0–15.0)
Lymphocytes Relative: 29 % (ref 12–46)
Lymphs Abs: 1.5 10*3/uL (ref 0.7–4.0)
MCV: 87.2 fL (ref 78.0–100.0)
Platelets: 208 10*3/uL (ref 150–400)
RBC: 4.13 MIL/uL (ref 3.87–5.11)
WBC: 5.2 10*3/uL (ref 4.0–10.5)

## 2013-07-20 LAB — COMPREHENSIVE METABOLIC PANEL
ALT: 12 U/L (ref 0–35)
Alkaline Phosphatase: 67 U/L (ref 39–117)
CO2: 27 mEq/L (ref 19–32)
Calcium: 9.1 mg/dL (ref 8.4–10.5)
GFR calc Af Amer: 56 mL/min — ABNORMAL LOW (ref >90)
GFR calc non Af Amer: 48 mL/min — ABNORMAL LOW (ref >90)
Glucose, Bld: 86 mg/dL (ref 70–99)
Sodium: 139 mEq/L (ref 135–145)

## 2013-07-20 NOTE — Progress Notes (Signed)
  Date: 07/20/2013  Patient name: Liah Morr  Medical record number: [id number]  Date of birth: Feb 13, 1928   This patient has been seen and the plan of care was discussed with the house staff. Please see their note for complete details. I concur with their findings with the following additions/corrections:  V/Q with low probability. No reported cough or increased SOB. She is requiring 2 L [place] at rest. Will need to check with ambulation. I do not suspect PNA. She is now euvolemic, can resume PO lasix 20 mg daily. Her Cr has improved.  I suspect underlying interstitial lung disease. She has evidence of possible RA and this could have led to undiagnosed lung involvement. The fact that she is not significantly symptomatic when O2 sats drop to 50% range tells me this is a chronic problem. Obtain ABG today. Ambulate to calculate O2 requirement with exertion. Would perform PFTs as outpatient. Discussed case with [facility] SW at bedside. Possible D/C today.  Jonah Blue, DO 07/20/2013, 1:29 PM

## 2013-07-20 NOTE — Progress Notes (Signed)
Subjective: No complaints today. Desat to 60s yest on room air, while ambulating. Spoke with daughter today, patient req home O2 2 years ago, for [facility], she was on home O2 for 1 month. Family says patient never smoked.    Objective: Vital signs in last 24 hours: Filed Vitals:   07/20/13 0001 07/20/13 0400 07/20/13 0412 07/20/13 0736  BP:  106/82  142/82  Pulse:  62  66  Temp: 97.3 F (36.3 C)  97.6 F (36.4 C) 98.1 F (36.7 C)  TempSrc: Oral  Oral Oral  Resp:  18  12  Height:      Weight:   89 lb 4.6 oz (40.5 kg)   SpO2:  100%  98%   Weight change: 2 lb 13.9 oz (1.3 kg)  Intake/Output Summary (Last 24 hours) at 07/20/13 0846 Last data filed at 07/19/13 1651  Gross per 24 hour  Intake    320 ml  Output    200 ml  Net    120 ml   General appearance: alert, cooperative, appears stated age and no distress Lungs: Heart: regular rate and rhythm, S1, S2 normal, no murmur, click, rub or gallop Abdomen: soft, non-tender; bowel sounds normal; no masses,  no organomegaly Extremities: extremities normal, atraumatic, no cyanosis or edema Lab Results: Basic Metabolic Panel:  Recent Labs Lab 07/18/13 1740 07/19/13 0930 07/20/13 0440  NA 141 137 139  K 3.7 3.3* 4.0  CL 100 98 102  CO2 30 24 27   GLUCOSE 77 176* 86  BUN 23 23 28*  CREATININE 1.37* 1.22* 1.04  CALCIUM 8.8 8.7 9.1  MG 2.0 1.9  --    Liver Function Tests:  Recent Labs Lab 07/20/13 0440  AST 20  ALT 12  ALKPHOS 67  BILITOT 0.2*  PROT 6.5  ALBUMIN 2.5*   CBC:  Recent Labs Lab 07/16/13 2020 07/17/13 0211 07/20/13 0440  WBC 7.2 7.2 5.2  NEUTROABS 5.8  --  2.2  HGB 11.0* 11.3* 11.4*  HCT 34.3* 34.6* 36.0  MCV 86.2 85.9 87.2  PLT 198 173 208   Cardiac Enzymes:  Recent Labs Lab 07/17/13 0211 07/17/13 0925 07/17/13 1450  TROPONINI <0.30 <0.30 <0.30   BNP:  Recent Labs Lab 07/16/13 2305  PROBNP 1069.0*   D-Dimer:  Recent Labs Lab 07/16/13 2020  DDIMER 1.06*   CBG:  Recent  Labs Lab 07/18/13 2046 07/19/13 0808 07/19/13 1249 07/19/13 1746 07/19/13 2155 07/20/13 0735  GLUCAP 100* 100* 102* 87 154* 93   Hemoglobin A1C:  Recent Labs Lab 07/17/13 0211  HGBA1C 6.0*   Urinalysis:  Recent Labs Lab 07/16/13 2200 07/17/13 1625  COLORURINE STRAW* YELLOW  LABSPEC 1.009 1.024  PHURINE 7.0 6.0  GLUCOSEU NEGATIVE NEGATIVE  HGBUR SMALL* SMALL*  BILIRUBINUR NEGATIVE NEGATIVE  KETONESUR NEGATIVE NEGATIVE  PROTEINUR NEGATIVE NEGATIVE  UROBILINOGEN 0.2 0.2  NITRITE NEGATIVE NEGATIVE  LEUKOCYTESUR MODERATE* TRACE*   Micro Results: Recent Results (from the past 240 hour(s))  URINE CULTURE     Status: None   Collection Time    07/16/13 10:00 PM      Result Value Range Status   Specimen Description URINE, CLEAN CATCH   Final   Special Requests ADDED 2227   Final   Culture  Setup Time 07/16/2013 23:27   Final   Colony Count 8,000 COLONIES/ML   Final   Culture INSIGNIFICANT GROWTH   Final   Report Status 07/18/2013 FINAL   Final  CULTURE, BLOOD (ROUTINE X 2)  Status: None   Collection Time    07/16/13 10:45 PM      Result Value Range Status   Specimen Description BLOOD RIGHT ARM   Final   Special Requests BOTTLES DRAWN AEROBIC ONLY 5CC   Final   Culture  Setup Time 07/17/2013 15:06   Final   Culture     Final   Value:        BLOOD CULTURE RECEIVED NO GROWTH TO DATE CULTURE WILL BE HELD FOR 5 DAYS BEFORE ISSUING A FINAL NEGATIVE REPORT   Report Status PENDING   Incomplete  CULTURE, BLOOD (ROUTINE X 2)     Status: None   Collection Time    07/16/13 10:54 PM      Result Value Range Status   Specimen Description BLOOD LEFT ARM   Final   Special Requests BOTTLES DRAWN AEROBIC ONLY 5CC   Final   Culture  Setup Time 07/17/2013 15:07   Final   Culture     Final   Value:        BLOOD CULTURE RECEIVED NO GROWTH TO DATE CULTURE WILL BE HELD FOR 5 DAYS BEFORE ISSUING A FINAL NEGATIVE REPORT   Report Status PENDING   Incomplete  MRSA PCR SCREENING      Status: None   Collection Time    07/17/13  1:16 AM      Result Value Range Status   MRSA by PCR NEGATIVE  NEGATIVE Final   Comment:            The GeneXpert MRSA Assay (FDA     approved for NASAL specimens     only), is one component of a     comprehensive MRSA colonization     surveillance program. It is not     intended to diagnose MRSA     infection nor to guide or     monitor treatment for     MRSA infections.  CLOSTRIDIUM DIFFICILE BY PCR     Status: None   Collection Time    07/19/13 10:56 AM      Result Value Range Status   C difficile by pcr NEGATIVE  NEGATIVE Final   Studies/Results: Nm Pulmonary Perf And Vent  07/19/2013   *RADIOLOGY REPORT*  Clinical Data: Hypoxia  NM PULMONARY VENTILATION AND PERFUSION SCAN  Views:  Anterior, posterior, left lateral, right lateral, RPO, LPO, RAO, LAO - ventilation and perfusion  Radiopharmaceutical: Technetium 102m DTPA - ventilation; technetium 73m macroaggregated albumin - perfusion  Dose:  40.0 mCi - ventilation; 6.0 mCi - perfusion  Route of administration:  Inhalation - ventilation; intravenous - perfusion  Comparison: Chest radiograph July 18, 2013  Findings:  On the ventilation study, there is decreased uptake in the left apex compared to other areas.  Indeed, there is slightly less uptake throughout the left lung compared the right, probably due to underlying emphysema.  On the perfusion study, there is relatively decreased uptake in the left apex in a pattern that matches the ventilation study IMPRE. The remainder of the perfusion study appears essentially normal.  There is no appreciable ventilation / perfusion mismatch.  Impression: Matching ventilation and perfusion defect in the left apex.  No appreciable ventilation / perfusion mismatch on this study.  These findings constitute an overall low probability of pulmonary embolus.   Original Report Authenticated By: Bretta Bang, M.D.   Medications: I have reviewed the patient's  current medications. Scheduled Meds: . aspirin  81 mg Oral Daily  . calcium-vitamin  D  1 tablet Oral Daily  . docusate sodium  100 mg Oral 3 times weekly  . donepezil  10 mg Oral QHS  . enoxaparin (LOVENOX) injection  20 mg Subcutaneous Q24H  . HYDROcodone-acetaminophen  1 tablet Oral TID  . multivitamin with minerals  1 tablet Oral Daily  . sodium chloride  3 mL Intravenous Q12H  . sodium chloride  3 mL Intravenous Q12H   Continuous Infusions:  PRN Meds:.sodium chloride, albuterol, ipratropium, sodium chloride Assessment/Plan:  # Acute Respiratory failure with hypoxia, likely due to fluid overload, as patient has a hx of dCHF- last EF-07/17/2013- 65-70%, was placed on IV frusemide- 40mg BID, urine output in the last 24 hrs- 200 . Patient developed contraction alkalosis, bump in Cr- 0.9 on admission to 1.22 -07/19/2013, . Px was previously on IV frusemide 40mg  BID, was discontinued. Health care associated pneumonia, is a likely aetiology, was treated with Pip-tazo and Vanc for 3 days, but clinical feats did not justify the continued use of IV antibiotics.  -D/c antibiotics.  -D/c Furosemide on admission. - Speech pathologist eval appreciated- unlikely swallowing impairment is contributing to her respiratory status.  -PT eval appreciated- Patient Saturations on Room Air at Rest = 92%  Patient Saturations on Room Air while Ambulating = 64%  Patient Saturations on 2 Liters of oxygen while Ambulating = 93%  - Px will be discharged home on O2. - V/Q scan to access for PE as a cause of difficulty breathing- showed reduced Vent and perf of L upper apex. But overall no V-Q mismatch and low probablity for PE.  -Await blood culture results- Prelim results- no growth. -Considerations at this time are for an Interstitial lung dx, that may explain her hypoxia, as she desat on ambulation. Patient has signif deformity of left hand- typical  Of  Rheumatoid arthritis, rheumatoid interstitial lung dx might be  present in this patient- therefore- ABG, and xray of the Left hand.  # Leg Pain- Complaints of Rt leg pain today, and later information that [patient complained of Lt leg pain]  - Therefore Xrays of Left and right ankle and foot- showed no concerning abn except for osteopenia.  -Venous doppler were neg for DVT, was done to R/o DVt as a cause of leg pain,also patient D.Dimer was elevated-1.06 ,and px also had SOB.  # Spinal stenosis-chronic. On home regimen of Vicodin 5-325mg  1 tablet TID.  -resume home medications   # Anemia-Hb on admission 11 with MCV 86.2. Appears chronic since 2011.  - No interventions at this time.   # Dementia-hx of Alzheimer's disease. On Aricept at home. Able to appropriately answer questions and alert and oriented x3 at time of admission.  -continue home dose Aricept  #DVT PPX- Enoxaparin.  Dispo: Disposition is deferred at this time, awaiting improvement of current medical problems.  Anticipated discharge is today.  The patient does have a current PCP (Pcp Not In System) and does need an [facility] hospital follow-up appointment after discharge.  The patient does not know have transportation limitations that hinder transportation to clinic appointments.  .Services Needed at time of discharge: Y = Yes, Blank = No PT:   OT:   RN:   Equipment:   Other:     LOS: 4 days   Kennis Carina, MD 07/20/2013, 8:46 AM

## 2013-07-20 NOTE — Progress Notes (Signed)
PT A/Ox4, VSS, order to transfer to med-surg. Report called and pt will be moved to room 4E7.  M.Foster Simpson, RN

## 2013-07-20 NOTE — Progress Notes (Signed)
Marylu Lund from [facility] phoned at 16:00 stating family had reservations about Ms. Bose going home. She would need to be able to climb 7 steps to get to her room. They are considering "[facility]" and would like her to stay overnight. MD notified and MD will speak directly to daughter, Nicole Cella.

## 2013-07-21 DIAGNOSIS — E785 Hyperlipidemia, unspecified: Secondary | ICD-10-CM

## 2013-07-21 DIAGNOSIS — D649 Anemia, unspecified: Secondary | ICD-10-CM

## 2013-07-21 LAB — GLUCOSE, CAPILLARY: Glucose-Capillary: 98 mg/dL (ref 70–99)

## 2013-07-21 MED ORDER — FUROSEMIDE 20 MG PO TABS: 20.0000 mg | ORAL_TABLET | Freq: Every day | ORAL | Status: AC

## 2013-07-21 NOTE — Progress Notes (Signed)
Report called to [facility] for client.  Bertram Savin LPN receiving nurse for report.

## 2013-07-21 NOTE — Clinical Social Work Placement (Signed)
Clinical Social Work Department CLINICAL SOCIAL WORK PLACEMENT NOTE 07/21/2013  Patient:  Mary Hartman,Mary Hartman  Account Number:  [id number] Admit date:  07/16/2013  Clinical Social Worker:  Lavell Luster  Date/time:  07/21/2013 02:37 PM  Clinical Social Work is seeking post-discharge placement for this patient at the following level of care:   SKILLED NURSING   (*CSW will update this form in Epic as items are completed)     Patient/family provided with [facility] Department of Clinical Social Works list of facilities offering this level of care within the geographic area requested by the patient (or if unable, by the patients family).    Patient/family informed of their freedom to choose among providers that offer the needed level of care, that participate in Medicare, Medicaid or managed care program needed by the patient, have an available bed and are willing to accept the patient.    Patient/family informed of [facility] ownership interest in [facility] as well as of the fact that they are under no obligation to receive care at this facility.  PASARR submitted to E[employer] on  PASARR number received from [employer] on   FL2 transmitted to all facilities in geographic area requested by pt/family on  07/21/2013 FL2 transmitted to all facilities within larger geographic area on   Patient informed that his/her managed care company has contracts with or will negotiate with  certain facilities, including the following:   Patient has existing PASRR. Patient is a [facility] patient who has contract with [facility] for placement. Arrangements were made for DC to [facility] prior to admission.     Patient/family informed of bed offers received:  07/21/2013 Patient chooses bed at [facility] Physician recommends and patient chooses bed at    Patient to be transferred to [facility] on  07/21/2013 Patient to be transferred to facility by  Ambulance  The following physician request were entered in Epic:   Additional Comments: Patient is being discharged to [facility] 07/21/13. Patient agreeable, nursing facility notified, and message has been left for daughter. CSW signing off Roddie Mc, Theresia Majors [phone]   Bryant Campbell, [place], [place], [phone]

## 2013-07-21 NOTE — Progress Notes (Signed)
Pt. Alert this am. No s/s of distress or discomfort noted. No c/o pain. Pt. Resting in bed quietly. VSS. Call light within reach. RN will continue to monitor pt. For changes in condition. Antanette Richwine, Cheryll Dessert

## 2013-07-21 NOTE — Progress Notes (Signed)
[facility].  IV and monitor removed.

## 2013-07-21 NOTE — Progress Notes (Signed)
Client transported to facility for Rehab.  MD I spoke with states that since she was already on Vicodin, a new RX is not needed at this time.  Daughter called and notified of Transfer.

## 2013-07-21 NOTE — Clinical Social Work Psychosocial (Signed)
Clinical Social Work Department BRIEF PSYCHOSOCIAL ASSESSMENT 07/21/2013  Patient:  Mary Hartman,Mary Hartman     Account Number:  [id number]     Admit date:  07/16/2013  Clinical Social Worker:  Lavell Luster  Date/Time:  07/21/2013 12:01 PM  Referred by:  Physician  Date Referred:  07/21/2013 Referred for  SNF Placement   Other Referral:   Interview type:  Patient Other interview type:   CSW interviewed and attempted to make contact with daughter.    PSYCHOSOCIAL DATA Living Status:  WITH ADULT CHILDREN Admitted from facility:   Level of care:   Primary support name:  Mary Hartman [phone] Primary support relationship to patient:  CHILD, ADULT Degree of support available:   Patient lives with daughter and patient spoke positively of daughter. Daughter has been to visit patient while in hospital and patient would like CSW to keep in touch with daughter. Support is good.    CURRENT CONCERNS Current Concerns  Post-Acute Placement   Other Concerns:    SOCIAL WORK ASSESSMENT / PLAN CSW met with patient to discuss the recommendation for SNF placement. Patient's speech is somewhat impaired due to past stroke, but patient stated that she was agreeable to going to a SNF. Patient's main concern seemed to be that CSW makes sure her daughter knows. Patient was living with daughter and was receiving home care from [facility]. CSW informed patient that a referral to [facility] had been made and that CSW is waiting to receive a bed offer. Patient was agreeable to this. CSW informed patient that her daughter will be contacted.   Assessment/plan status:  No Further Intervention Required Other assessment/ plan:   Complete FL2 and PASARR. Continue trying to reach patient's daughter. Patient stated that daughter works until 6pm.   Information/referral to community resources:   Patient was not given SNF list as she will be going to [place]. The process was thoroughly explained to  patient.    PATIENTS/FAMILYS RESPONSE TO PLAN OF CARE: Patient was very pleasant and agreeable to going to SNF. Patient would like daughter to be contacted to let her know about the plan. CSW has attempted to contact patient's daughter and will continue to do so.       Mary Hartman, [place], [place], [phone]

## 2013-07-21 NOTE — Progress Notes (Signed)
Subjective: No complaints today, eager to go home, saturating in the 90s on room air. Tolerating orally.  Objective: Vital signs in last 24 hours: Filed Vitals:   07/20/13 1933 07/20/13 2001 07/21/13 0024 07/21/13 0600  BP: 133/87 121/77 151/74 148/73  Pulse: 75 82 68 62  Temp: 98.2 F (36.8 C) 98.1 F (36.7 C)  97.2 F (36.2 C)  TempSrc: Oral Oral  Oral  Resp: 19 18 18 18   Height:      Weight:  89 lb 4.6 oz (40.5 kg)  89 lb 1.1 oz (40.4 kg)  SpO2: 100% 96% 97% 100%   Weight change: 0 lb (0 kg)  Intake/Output Summary (Last 24 hours) at 07/21/13 1315 Last data filed at 07/21/13 9604  Gross per 24 hour  Intake    480 ml  Output      0 ml  Net    480 ml   General appearance: alert, cooperative, appears stated age and no distress  Lungs: clear to auscultation.  Heart: regular rate and rhythm, S1, S2 normal, no murmur, click, rub or gallop  Abdomen: soft, non-tender; bowel sounds normal; no masses, no organomegaly  Extremities: extremities normal, atraumatic, no cyanosis or edema  Lab Results: Basic Metabolic Panel:  Recent Labs Lab 07/18/13 1740 07/19/13 0930 07/20/13 0440  NA 141 137 139  K 3.7 3.3* 4.0  CL 100 98 102  CO2 30 24 27   GLUCOSE 77 176* 86  BUN 23 23 28*  CREATININE 1.37* 1.22* 1.04  CALCIUM 8.8 8.7 9.1  MG 2.0 1.9  --    Liver Function Tests:  Recent Labs Lab 07/20/13 0440  AST 20  ALT 12  ALKPHOS 67  BILITOT 0.2*  PROT 6.5  ALBUMIN 2.5*   CBC:  Recent Labs Lab 07/16/13 2020 07/17/13 0211 07/20/13 0440  WBC 7.2 7.2 5.2  NEUTROABS 5.8  --  2.2  HGB 11.0* 11.3* 11.4*  HCT 34.3* 34.6* 36.0  MCV 86.2 85.9 87.2  PLT 198 173 208   Cardiac Enzymes:  Recent Labs Lab 07/17/13 0211 07/17/13 0925 07/17/13 1450  TROPONINI <0.30 <0.30 <0.30   BNP:  Recent Labs Lab 07/16/13 2305  PROBNP 1069.0*   D-Dimer:  Recent Labs Lab 07/16/13 2020  DDIMER 1.06*   CBG:  Recent Labs Lab 07/20/13 0735 07/20/13 1204  07/20/13 1727 07/21/13 0144 07/21/13 0557 07/21/13 1126  GLUCAP 93 125* 94 116* 98 106*   Hemoglobin A1C:  Recent Labs Lab 07/17/13 0211  HGBA1C 6.0*   Urinalysis:  Recent Labs Lab 07/16/13 2200 07/17/13 1625  COLORURINE STRAW* YELLOW  LABSPEC 1.009 1.024  PHURINE 7.0 6.0  GLUCOSEU NEGATIVE NEGATIVE  HGBUR SMALL* SMALL*  BILIRUBINUR NEGATIVE NEGATIVE  KETONESUR NEGATIVE NEGATIVE  PROTEINUR NEGATIVE NEGATIVE  UROBILINOGEN 0.2 0.2  NITRITE NEGATIVE NEGATIVE  LEUKOCYTESUR MODERATE* TRACE*    Micro Results: Recent Results (from the past 240 hour(s))  URINE CULTURE     Status: None   Collection Time    07/16/13 10:00 PM      Result Value Range Status   Specimen Description URINE, CLEAN CATCH   Final   Special Requests ADDED 2227   Final   Culture  Setup Time 07/16/2013 23:27   Final   Colony Count 8,000 COLONIES/ML   Final   Culture INSIGNIFICANT GROWTH   Final   Report Status 07/18/2013 FINAL   Final  CULTURE, BLOOD (ROUTINE X 2)     Status: None   Collection Time    07/16/13  10:45 PM      Result Value Range Status   Specimen Description BLOOD RIGHT ARM   Final   Special Requests BOTTLES DRAWN AEROBIC ONLY 5CC   Final   Culture  Setup Time 07/17/2013 15:06   Final   Culture     Final   Value:        BLOOD CULTURE RECEIVED NO GROWTH TO DATE CULTURE WILL BE HELD FOR 5 DAYS BEFORE ISSUING A FINAL NEGATIVE REPORT   Report Status PENDING   Incomplete  CULTURE, BLOOD (ROUTINE X 2)     Status: None   Collection Time    07/16/13 10:54 PM      Result Value Range Status   Specimen Description BLOOD LEFT ARM   Final   Special Requests BOTTLES DRAWN AEROBIC ONLY 5CC   Final   Culture  Setup Time 07/17/2013 15:07   Final   Culture     Final   Value:        BLOOD CULTURE RECEIVED NO GROWTH TO DATE CULTURE WILL BE HELD FOR 5 DAYS BEFORE ISSUING A FINAL NEGATIVE REPORT   Report Status PENDING   Incomplete  MRSA PCR SCREENING     Status: None   Collection Time     07/17/13  1:16 AM      Result Value Range Status   MRSA by PCR NEGATIVE  NEGATIVE Final   Comment:            The GeneXpert MRSA Assay (FDA     approved for NASAL specimens     only), is one component of a     comprehensive MRSA colonization     surveillance program. It is not     intended to diagnose MRSA     infection nor to guide or     monitor treatment for     MRSA infections.  CLOSTRIDIUM DIFFICILE BY PCR     Status: None   Collection Time    07/19/13 10:56 AM      Result Value Range Status   C difficile by pcr NEGATIVE  NEGATIVE Final   Studies/Results: Dg Hand 2 View Left  07/20/2013   *RADIOLOGY REPORT*  Clinical Data: Left hand deformity.  Question rheumatoid disease.  LEFT HAND - 2 VIEW  Comparison: None.  Findings: Marked subluxation of the second to the through fifth metacarpal phalangeal joint space.  Flexion of the left fifth finger.  No erosive changes as is typically seen with late stage rheumatoid arthritis.  IMPRESSION: Marked subluxation of the second to the through fifth metacarpal phalangeal joint space.  Flexion of the left fifth finger.  No erosive changes as is typically seen with late stage rheumatoid arthritis.   Original Report Authenticated By: Lacy Duverney, M.D.   Nm Pulmonary Perf And Vent  07/19/2013   *RADIOLOGY REPORT*  Clinical Data: Hypoxia  NM PULMONARY VENTILATION AND PERFUSION SCAN  Views:  Anterior, posterior, left lateral, right lateral, RPO, LPO, RAO, LAO - ventilation and perfusion  Radiopharmaceutical: Technetium 48m DTPA - ventilation; technetium 28m macroaggregated albumin - perfusion  Dose:  40.0 mCi - ventilation; 6.0 mCi - perfusion  Route of administration:  Inhalation - ventilation; intravenous - perfusion  Comparison: Chest radiograph July 18, 2013  Findings:  On the ventilation study, there is decreased uptake in the left apex compared to other areas.  Indeed, there is slightly less uptake throughout the left lung compared the right,  probably due to underlying emphysema.  On the  perfusion study, there is relatively decreased uptake in the left apex in a pattern that matches the ventilation study IMPRE. The remainder of the perfusion study appears essentially normal.  There is no appreciable ventilation / perfusion mismatch.  Impression: Matching ventilation and perfusion defect in the left apex.  No appreciable ventilation / perfusion mismatch on this study.  These findings constitute an overall low probability of pulmonary embolus.   Original Report Authenticated By: Bretta Bang, M.D.   Medications: I have reviewed the patient's current medications. Scheduled Meds: . aspirin  81 mg Oral Daily  . calcium-vitamin D  1 tablet Oral Daily  . docusate sodium  100 mg Oral 3 times weekly  . donepezil  10 mg Oral QHS  . enoxaparin (LOVENOX) injection  20 mg Subcutaneous Q24H  . HYDROcodone-acetaminophen  1 tablet Oral TID  . multivitamin with minerals  1 tablet Oral Daily  . sodium chloride  3 mL Intravenous Q12H  . sodium chloride  3 mL Intravenous Q12H   Continuous Infusions:  PRN Meds:.sodium chloride, albuterol, ipratropium, sodium chloride Assessment/Plan:  # Acute Respiratory failure with hypoxia, likely due to fluid overload, as patient has a hx of dCHF- last EF-07/17/2013- 65-70%, was placed on IV frusemide- 40mg BID, urine output in the last 24 hrs- 200 . Patient developed contraction alkalosis, bump in Cr- 0.9 on admission to 1.22 -07/19/2013, . Px was previously on IV frusemide 40mg  BID, was discontinued. - Speech pathologist eval appreciated- unlikely swallowing impairment is contributing to her respiratory status.  - V/Q scan to access for PE as a cause of difficulty breathing- showed reduced Vent and perf of L upper apex. But overall no V-Q mismatch and low probablity for PE.  -Await blood culture results- no growth.  -Considerations at this time are for an Interstitial lung dx, that may explain her hypoxia, as she  desat on ambulation. Patient has signif deformity of left hand- typical Of Rheumatoid arthritis, rheumatoid interstitial lung dx might be present in this patient- therefore- ABG was done and xray of the Left hand. ABG- showed A-a gradient exactly appropriate for age, but patients hands have poor arterial perfusion leading to spuriously low O2 sats. Also Xray of the Lt hand showed deformity not xtic of erosive findings seen in Rh arthritis. There Rheumatoid lung dx not likley. -Will cont Oral frusemide on discharge- 20mg  daily. # Spinal stenosis-chronic. On home regimen of Vicodin 5-325mg  1 tablet TID.  -resume home medications   # Anemia-Hb on admission 11 with MCV 86.2. Appears chronic since 2011.  - No interventions at this time.   # Dementia-hx of Alzheimer's disease. On Aricept at home. Able to appropriately answer questions and alert and oriented x3 at time of admission.  -continue home dose Aricept  #DVT PPX- Enoxaparin.  Dispo: Disposition is deferred at this time, awaiting improvement of current medical problems.  Anticipated discharge- today.   The patient does have a current PCP (Pcp Not In System) and does need an [facility] hospital follow-up appointment after discharge.  The patient does not have transportation limitations that hinder transportation to clinic appointments.  .Services Needed at time of discharge: Y = Yes, Blank = No PT:   OT:   RN:   Equipment:   Other:     LOS: 5 days   Kennis Carina, MD 07/21/2013, 1:15 PM

## 2013-07-21 NOTE — Discharge Summary (Signed)
Name: Mary Hartman MRN: [id number] DOB: Apr 11, 1928 79 y.o. PCP: Pcp Not In System  Date of Admission: 07/16/2013  7:36 PM Date of Discharge: 07/21/2013 Attending Physician: Jonah Blue, DO  Discharge Diagnosis: Principal Problem:   Acute respiratory failure with hypoxia Active Problems:   Anemia   Alzheimer's disease   Spinal stenosis   Fever   Chronic diastolic CHF (congestive heart failure)  Discharge Medications:   Medication List         acetaminophen 325 MG tablet  Commonly known as:  TYLENOL  Take 325 mg by mouth 2 (two) times daily as needed for pain.     aspirin 81 MG chewable tablet  Chew 81 mg by mouth daily.     BOOST PO  Take 1 Can by mouth daily.     Calcium Carb-Cholecalciferol 600-400 MG-UNIT Tabs  Take 1 tablet by mouth daily.     citalopram 10 MG tablet  Commonly known as:  CELEXA  Take 10 mg by mouth at bedtime.     docusate sodium 100 MG capsule  Commonly known as:  COLACE  Take 100 mg by mouth 3 (three) times a week. Tuesday, Thursday, Saturday     donepezil 10 MG tablet  Commonly known as:  ARICEPT  Take 10 mg by mouth at bedtime.     furosemide 20 MG tablet  Commonly known as:  LASIX  Take 1 tablet (20 mg total) by mouth daily.     HYDROcodone-acetaminophen 5-325 MG per tablet  Commonly known as:  NORCO/VICODIN  Take 1 tablet by mouth 3 (three) times daily.     multivitamin with minerals Tabs  Take 1 tablet by mouth daily.        Disposition and follow-up:   Ms.Wilsie Deshler was discharged from [facility] in Stable condition.  At the hospital follow up visit please address:   Follow-up Appointments:     Follow-up Information   Follow up with Pcp Not In System.   Contact information:   [facility], [place]. [phone]      Discharge Instructions: Discharge Orders   Future Orders Complete By Expires     (HEART FAILURE PATIENTS) Call MD:  Anytime you have any of the following symptoms: 1) 3  pound weight gain in 24 hours or 5 pounds in 1 week 2) shortness of breath, with or without a dry hacking cough 3) swelling in the hands, feet or stomach 4) if you have to sleep on extra pillows at night in order to breathe.  As directed     Call MD for:  difficulty breathing, headache or visual disturbances  As directed     Diet - low sodium heart healthy  As directed     Discharge instructions  As directed     Comments:      Patient can continue with her regular home medications. We are prescribing Furosemide to be taken 20mg - that is 1 tab once a day.    Increase activity slowly  As directed        Consultations:    Procedures Performed:  Dg Chest 2 View  07/18/2013   *RADIOLOGY REPORT*  Clinical Data: Shortness of breath.  CHEST - 2 VIEW  Comparison: 07/16/2013.  Findings: The cardiac silhouette, mediastinal and hilar contours are stable.  There is tortuosity and ectasia of the thoracic aorta. Chronic bronchitic type interstitial lung disease and probable pulmonary fibrosis. Improved aeration when compared to prior films suggesting resolving overlying  interstitial pulmonary edema.  No pneumothorax.  IMPRESSION: Chronic interstitial lung disease with resolving pulmonary edema.   Original Report Authenticated By: Rudie Meyer, M.D.   Dg Chest 2 View  07/16/2013   *RADIOLOGY REPORT*  Clinical Data: CHF versus pneumonia  CHEST - 2 VIEW  Comparison: Prior radiograph performed earlier on the same day 1954  Findings: Cardiac and mediastinal silhouettes are unchanged as compared to the prior examination.  Diffuse prominence of the interstitial markings is again seen, slightly improved as compared to the prior examination, most consistent with improved edema.  No new focal consolidation to suggest pneumonia identified. No pneumothorax.  No definite pleural effusion.  IMPRESSION: Slight interval improvement in diffuse prominence of the interstitial markings, most consistent with persistent but improved  pulmonary edema.  No airspace focal infiltrates to suggest consolidative pneumonia identified.   Original Report Authenticated By: Rise Mu, M.D.   Dg Ankle 2 Views Left  07/17/2013   *RADIOLOGY REPORT*  Clinical Data: Left ankle pain after injury.  LEFT ANKLE - 2 VIEW  Comparison: None.  Findings: Imaged bones, joints and soft tissues appear normal.  IMPRESSION: Negative exam.   Original Report Authenticated By: Holley Dexter, M.D.   Dg Ankle 2 Views Right  07/17/2013   *RADIOLOGY REPORT*  Clinical Data: Right foot pain.  RIGHT ANKLE - 2 VIEW  Comparison: None.  Findings: No acute bony or joint abnormality is identified.  IM nail the distal tibia with two interlocking screws is identified. Bones appear osteopenic. Soft tissues are unremarkable.  IMPRESSION:  1.  No acute abnormality. 2.  Osteopenia. 3.  Partial visualization of an IM nail in the right tibia.   Original Report Authenticated By: Holley Dexter, M.D.   Dg Hand 2 View Left  07/20/2013   *RADIOLOGY REPORT*  Clinical Data: Left hand deformity.  Question rheumatoid disease.  LEFT HAND - 2 VIEW  Comparison: None.  Findings: Marked subluxation of the second to the through fifth metacarpal phalangeal joint space.  Flexion of the left fifth finger.  No erosive changes as is typically seen with late stage rheumatoid arthritis.  IMPRESSION: Marked subluxation of the second to the through fifth metacarpal phalangeal joint space.  Flexion of the left fifth finger.  No erosive changes as is typically seen with late stage rheumatoid arthritis.   Original Report Authenticated By: Lacy Duverney, M.D.   Nm Pulmonary Perf And Vent  07/19/2013   *RADIOLOGY REPORT*  Clinical Data: Hypoxia  NM PULMONARY VENTILATION AND PERFUSION SCAN  Views:  Anterior, posterior, left lateral, right lateral, RPO, LPO, RAO, LAO - ventilation and perfusion  Radiopharmaceutical: Technetium 21m DTPA - ventilation; technetium 16m macroaggregated albumin - perfusion   Dose:  40.0 mCi - ventilation; 6.0 mCi - perfusion  Route of administration:  Inhalation - ventilation; intravenous - perfusion  Comparison: Chest radiograph July 18, 2013  Findings:  On the ventilation study, there is decreased uptake in the left apex compared to other areas.  Indeed, there is slightly less uptake throughout the left lung compared the right, probably due to underlying emphysema.  On the perfusion study, there is relatively decreased uptake in the left apex in a pattern that matches the ventilation study IMPRE. The remainder of the perfusion study appears essentially normal.  There is no appreciable ventilation / perfusion mismatch.  Impression: Matching ventilation and perfusion defect in the left apex.  No appreciable ventilation / perfusion mismatch on this study.  These findings constitute an overall low probability of pulmonary embolus.  Original Report Authenticated By: Bretta Bang, M.D.   Dg Chest Port 1 View  07/16/2013   *RADIOLOGY REPORT*  Clinical Data: Shortness of breath, nonsmoker  PORTABLE CHEST - 1 VIEW  Comparison: 08/11/2012  Findings: There is diffuse prominence of the interstitial markings with a few Kerley B lines and mild cardiomegaly.  Trace dependent pleural fluid noted.  No pneumothorax.  No focal pulmonary opacity.  IMPRESSION: Constellation of findings suggesting mild interstitial edema. If the patient's symptoms continue, consider PA and lateral chest radiographs obtained at full inspiration when the patient is clinically able.   Original Report Authenticated By: Christiana Pellant, M.D.   Dg Foot 2 Views Left  07/17/2013   *RADIOLOGY REPORT*  Clinical Data: Left foot pain, injury.  LEFT FOOT - 2 VIEW  Comparison: None.  Findings: No acute bony or joint abnormality is identified.  Soft tissue structures are unremarkable.  IMPRESSION: No acute finding.   Original Report Authenticated By: Holley Dexter, M.D.   Dg Foot 2 Views Right  07/17/2013   *RADIOLOGY  REPORT*  Clinical Data: Right foot pain.  RIGHT FOOT - 2 VIEW  Comparison: None.  Findings: No acute bony or joint abnormality is identified.  Bones are osteopenic.  Hallux valgus deformity is noted. Soft tissue structures are unremarkable.  IMPRESSION:  1.  No acute finding. 2.  Osteopenia. 3.  Hallux valgus.   Original Report Authenticated By: Holley Dexter, M.D.    Admission HPI: Chief Complaint: Worsening SOB, fatigue, SpO2 in the 70's at home.  History of Present Illness:  Ms. Liberta Gimpel is a 79 y.o. y/o female w/ PMHx of CHF, hypercholesterolemia, OA, osteoporosis, and spinal stenosis presents to the ED w/ complaints of worsening SOB and fatigue. The patient lives with her daughter and she said that she has recently been more SOB than normal, mostly with exertion. Her daughter has also noticed that she has been fatigued recently and hasn't seemed like her normal self. She checked her SpO2 at home because she was having difficulty breathing and found it to be 74% this evening, which is when they came to the ED. On arrival in the ED, she was found to have a fever of 101.4 as well.  The patient denies any other recent issues except for a recent cough productive of green sputum. She denies any recent illness, no fever or chills, no orthopnea or PND, chest pain, nausea, vomiting, dizziness, lightheadedness, LOC, diarrhea, or abdominal pain.  She [facility] the [facility] at [facility], uses a walker to ambulate and is looked after by her daughter.   Review of Systems:  General: Denies fever, chills, diaphoresis, appetite change and fatigue.  HEENT: Denies change in vision, eye pain, redness, hearing loss, congestion, sore throat, rhinorrhea, sneezing, mouth sores, trouble swallowing, neck pain, neck stiffness and tinnitus.  Respiratory: Complains of recent SOB, mostly with ambulation. Also complains of a cough productive of greenish sputum. Denies chest tightness, and wheezing.  Cardiovascular: Denies chest  pain, palpitations and leg swelling.  Gastrointestinal: Denies nausea, vomiting, abdominal pain, diarrhea, constipation, blood in stool and abdominal distention.  Genitourinary: Denies dysuria, urgency, frequency, hematuria, flank pain and difficulty urinating.  Endocrine: Denies hot or cold intolerance, sweats, polyuria, polydipsia.  Musculoskeletal: Denies myalgias, back pain, joint swelling, arthralgias and gait problem.  Skin: Denies pallor, rash and wounds.  Neurological: Denies dizziness, seizures, syncope, weakness, lightheadedness, numbness and headaches.  Hematological: Denies adenopathy,easy bruising, personal or family bleeding history.  Psychiatric/Behavioral: Denies mood changes, confusion, nervousness, sleep disturbance  and agitation.  Physical Exam:  Blood pressure 141/74, pulse 83, temperature 98.8 F (37.1 C), temperature source Oral, resp. rate 21, height 4' 9.09" (1.45 m), weight 88 lb 6.5 oz (40.1 kg), SpO2 98.00%.  Filed Vitals:    07/16/13 2007  07/16/13 2022  07/16/13 2300  07/17/13 0111   BP:    162/82  141/74   Pulse:    79  83   Temp:  101.4 F (38.6 C)    98.8 F (37.1 C)   TempSrc:  Rectal    Oral   Resp:    23  21   Height:     4' 9.09" (1.45 m)   Weight:     88 lb 6.5 oz (40.1 kg)   SpO2:   99%  97%  98%    General: Vital signs reviewed. Patient is an elderly female, in mild respiratory distress, cooperative with exam. Alert and oriented x3.  Head: Normocephalic and atraumatic.  Nose: No erythema or drainage noted. Turbinates normal.  Mouth: No erythema, exudates, sores, or ulcerations. Moist mucus membranes.  Eyes: PERRL, EOMI, conjunctivae normal, No scleral icterus.  Neck: Supple, trachea midline, normal ROM, No JVD, masses, thyromegaly, or carotid bruit present. Carotid pulse visible.  Cardiovascular: RRR, S1 normal, S2 normal, no murmurs, gallops, or rubs.  Pulmonary/Chest: In mild respiratory distress, on venturi mask. Air entry equal bilaterally.  Significant crackles heard in mid and lower lung fields bilaterally.  Abdominal: Soft. Non-tender, non-distended, bowel sounds are normal, no masses, organomegaly, or guarding present.  Musculoskeletal: Arthritic changes seen in hands. Left hand w/ deformity from previous accident. No erythema, or stiffness, ROM full and no nontender.  Extremities: Trace edema in LE's. Pulses symmetric and intact bilaterally. No cyanosis or clubbing. Neurological: A&O x3, Strength is normal and symmetric bilaterally, cranial nerve II-XII are grossly intact, no focal motor deficit, sensory intact to light touch bilaterally.  Skin: Warm, dry and intact. No rashes or erythema. Psychiatric: Normal mood and affect.    Hospital Course by problem list:  # Acute Respiratory Failure with hypoxia- Patient presented with cough, productive of greenish sputum, SOB, and mild fever, initial considerations were for a health care assoc PNA, she was therefore placed on Vanc and PIP- Tazo., but CBC, chest x-ray and blood cultures were not supportive, so antibiotics were discont, as there was also no clinical justification for continued antibiotic therapy. 2nd and equally likely aetiology was a dCHF, as px has a hx, chest xray findings, and clinical improvement on IV frusemide 40mg  BID, started in the ED and while on admission were supportive. Chest xray findings showing improvement in pulm edema were consistent with acute respiratory distress due to dCHF. Patient also had speech evaluation  to rule out aspiration as the likely cause of her difficulty breathing. With Clinical improvement, it was noticed patient desat on ambulation, interstial lung dx was R/o by ABG done, which showed a normal A-a grad, exactly appropriate for her age.  Patients fingers have poor perfusion and are therefore not the best to access SPo2, resulting in spurious results. On discharge patient had signif improvement of her symptoms, sat- in the mid to high 90s on  room air., with no cough or SOB. Patient will need PT for rehab, so will be discharged to a SNF.  # Anemia- Hb on admission 11 with MCV 86.2. Appears chronic since 2011. On discharge- CBC was 11.4, unchanged form admission. No interventions deemed neccesary on admission.   # Alzheimers- On  Aricept at home. Px was able to appropriately answer questions and alert and oriented x3 at time of admission and on discharge. Home does of Aricept was continued while on admission and px was discharged on it.  #Spinal Stenosis- Px was continued on home pain meds and discharged on it - Hydrocodone-acetaminophen- (5-325mg ) 1 tab 3times daily.  #Chronic Diastolic CHF- Last EF done- 07/17/2013- 65-70%. Discharged home on Furosemide- 20mg  daily.  Discharge Vitals:   BP 148/73  Pulse 62  Temp(Src) 97.2 F (36.2 C) (Oral)  Resp 18  Ht 4' 9.09" (1.45 m)  Wt 89 lb 1.1 oz (40.4 kg)  BMI 19.22 kg/m2  SpO2 100%  Discharge Labs:  Results for orders placed during the hospital encounter of 07/16/13 (from the past 24 hour(s))  GLUCOSE, CAPILLARY     Status: None   Collection Time    07/20/13  5:27 PM      Result Value Range   Glucose-Capillary 94  70 - 99 mg/dL  GLUCOSE, CAPILLARY     Status: Abnormal   Collection Time    07/21/13  1:44 AM      Result Value Range   Glucose-Capillary 116 (*) 70 - 99 mg/dL   Comment 1 Notify RN    GLUCOSE, CAPILLARY     Status: None   Collection Time    07/21/13  5:57 AM      Result Value Range   Glucose-Capillary 98  70 - 99 mg/dL  GLUCOSE, CAPILLARY     Status: Abnormal   Collection Time    07/21/13 11:26 AM      Result Value Range   Glucose-Capillary 106 (*) 70 - 99 mg/dL   Comment 1 Notify RN      Signed: Kennis Carina, MD 07/21/2013, 1:29 PM   Time Spent on Discharge: 40 minutes

## 2013-07-21 NOTE — Discharge Summary (Signed)
  Date: 07/21/2013  Patient name: Francisca Langenderfer  Medical record number: [id number]  Date of birth: [birth year]   This patient has been seen and the plan of care was discussed with the house staff. Please see their note for complete details. I concur with their findings with the following additions/corrections:  She is doing well from a clinical standpoint. No further evidence of hypoxia. Reviewed ABG. She has NO A-a gradient. Her reported hypoxia was secondary to a poor pleth monitor and cold extremities. She is not oxygen requiring at rest or with exertion. Admission Dx is HFpEF that is now compensated. Continue Lasix 20 mg PO daily as outpatient.  Jonah Blue, DO 07/21/2013, 2:36 PM

## 2013-07-23 LAB — CULTURE, BLOOD (ROUTINE X 2)

## 2013-09-19 ENCOUNTER — Other Ambulatory Visit: Payer: Self-pay | Admitting: Family Medicine

## 2013-09-19 DIAGNOSIS — M542 Cervicalgia: Secondary | ICD-10-CM

## 2013-09-27 ENCOUNTER — Ambulatory Visit
Admission: RE | Admit: 2013-09-27 | Discharge: 2013-09-27 | Disposition: A | Discharge status: Home / Self Care | Payer: No Typology Code available for payment source | Source: Ambulatory Visit | Attending: Family Medicine | Admitting: Family Medicine

## 2013-09-27 DIAGNOSIS — M542 Cervicalgia: Secondary | ICD-10-CM

## 2013-09-27 MED ORDER — IOHEXOL 300 MG/ML  SOLN
1.0000 mL | Freq: Once | INTRAMUSCULAR | Status: AC | PRN
  Administered 2013-09-27: 1 mL via EPIDURAL

## 2013-09-27 MED ORDER — TRIAMCINOLONE ACETONIDE 40 MG/ML IJ SUSP (RADIOLOGY): 60.0000 mg | Freq: Once | INTRAMUSCULAR | Status: DC

## 2013-09-27 NOTE — Progress Notes (Addendum)
Dr. Alfredo Batty unable to get into epidural space to do steroid injection. Cold pack to site of attempted injection.

## 2013-11-14 ENCOUNTER — Other Ambulatory Visit: Payer: Self-pay | Admitting: *Deleted

## 2013-11-14 ENCOUNTER — Ambulatory Visit
Admission: RE | Admit: 2013-11-14 | Discharge: 2013-11-14 | Disposition: A | Discharge status: Home / Self Care | Payer: No Typology Code available for payment source | Source: Ambulatory Visit | Attending: *Deleted | Admitting: *Deleted

## 2013-11-14 DIAGNOSIS — R0602 Shortness of breath: Secondary | ICD-10-CM

## 2013-11-14 DIAGNOSIS — R531 Weakness: Secondary | ICD-10-CM

## 2013-11-14 DIAGNOSIS — R042 Hemoptysis: Secondary | ICD-10-CM

## 2013-11-28 DIAGNOSIS — I441 Atrioventricular block, second degree: Secondary | ICD-10-CM

## 2013-11-28 HISTORY — DX: Atrioventricular block, second degree: I44.1

## 2013-12-09 ENCOUNTER — Encounter: Payer: Self-pay | Admitting: Cardiovascular Disease

## 2013-12-09 ENCOUNTER — Encounter (HOSPITAL_COMMUNITY): Payer: Self-pay | Admitting: *Deleted

## 2013-12-09 ENCOUNTER — Ambulatory Visit: Payer: No Typology Code available for payment source | Admitting: Cardiovascular Disease

## 2013-12-09 ENCOUNTER — Ambulatory Visit (INDEPENDENT_AMBULATORY_CARE_PROVIDER_SITE_OTHER): Payer: Medicare (Managed Care) | Admitting: Cardiovascular Disease

## 2013-12-09 ENCOUNTER — Inpatient Hospital Stay (HOSPITAL_COMMUNITY): Payer: Medicare (Managed Care)

## 2013-12-09 ENCOUNTER — Encounter (HOSPITAL_COMMUNITY)
Admission: AD | Disposition: A | Discharge status: Home / Self Care | Payer: Self-pay | Source: Ambulatory Visit | Attending: Internal Medicine

## 2013-12-09 ENCOUNTER — Inpatient Hospital Stay (HOSPITAL_COMMUNITY)
Admission: AD | Admit: 2013-12-09 | Discharge: 2013-12-10 | DRG: 242 | Disposition: A | Discharge status: Home / Self Care | Payer: Medicare (Managed Care) | Source: Ambulatory Visit | Attending: Internal Medicine | Admitting: Internal Medicine

## 2013-12-09 VITALS — BP 120/80 | HR 35 | Ht <= 58 in | Wt 92.0 lb

## 2013-12-09 DIAGNOSIS — Z7982 Long term (current) use of aspirin: Secondary | ICD-10-CM

## 2013-12-09 DIAGNOSIS — I5032 Chronic diastolic (congestive) heart failure: Secondary | ICD-10-CM | POA: Diagnosis present

## 2013-12-09 DIAGNOSIS — E43 Unspecified severe protein-calorie malnutrition: Secondary | ICD-10-CM | POA: Diagnosis present

## 2013-12-09 DIAGNOSIS — F0280 Dementia in other diseases classified elsewhere without behavioral disturbance: Secondary | ICD-10-CM | POA: Diagnosis present

## 2013-12-09 DIAGNOSIS — G309 Alzheimer's disease, unspecified: Secondary | ICD-10-CM | POA: Diagnosis present

## 2013-12-09 DIAGNOSIS — I441 Atrioventricular block, second degree: Secondary | ICD-10-CM

## 2013-12-09 DIAGNOSIS — R001 Bradycardia, unspecified: Secondary | ICD-10-CM

## 2013-12-09 DIAGNOSIS — I442 Atrioventricular block, complete: Secondary | ICD-10-CM

## 2013-12-09 DIAGNOSIS — I509 Heart failure, unspecified: Secondary | ICD-10-CM | POA: Diagnosis present

## 2013-12-09 DIAGNOSIS — Z79899 Other long term (current) drug therapy: Secondary | ICD-10-CM

## 2013-12-09 DIAGNOSIS — I498 Other specified cardiac arrhythmias: Secondary | ICD-10-CM | POA: Diagnosis present

## 2013-12-09 DIAGNOSIS — R55 Syncope and collapse: Secondary | ICD-10-CM

## 2013-12-09 DIAGNOSIS — E7800 Pure hypercholesterolemia, unspecified: Secondary | ICD-10-CM | POA: Diagnosis present

## 2013-12-09 DIAGNOSIS — I10 Essential (primary) hypertension: Secondary | ICD-10-CM | POA: Diagnosis present

## 2013-12-09 DIAGNOSIS — M810 Age-related osteoporosis without current pathological fracture: Secondary | ICD-10-CM | POA: Diagnosis present

## 2013-12-09 HISTORY — PX: PERMANENT PACEMAKER INSERTION: SHX5480

## 2013-12-09 HISTORY — PX: PACEMAKER INSERTION: SHX728

## 2013-12-09 HISTORY — DX: Atrioventricular block, second degree: I44.1

## 2013-12-09 LAB — BASIC METABOLIC PANEL
BUN: 22 mg/dL (ref 6–23)
Calcium: 8.9 mg/dL (ref 8.4–10.5)
Chloride: 109 mEq/L (ref 96–112)
Creatinine, Ser: 1.26 mg/dL — ABNORMAL HIGH (ref 0.50–1.10)
GFR calc Af Amer: 44 mL/min — ABNORMAL LOW (ref >90)
Glucose, Bld: 87 mg/dL (ref 70–99)
Potassium: 4.6 mEq/L (ref 3.5–5.1)

## 2013-12-09 LAB — CBC
HCT: 36.9 % (ref 36.0–46.0)
Hemoglobin: 12 g/dL (ref 12.0–15.0)
MCH: 28.9 pg (ref 26.0–34.0)
MCHC: 32.5 g/dL (ref 30.0–36.0)
MCV: 88.9 fL (ref 78.0–100.0)
Platelets: 189 10*3/uL (ref 150–400)
RDW: 15.6 % — ABNORMAL HIGH (ref 11.5–15.5)
WBC: 5.3 10*3/uL (ref 4.0–10.5)

## 2013-12-09 LAB — APTT: aPTT: 30 seconds (ref 24–37)

## 2013-12-09 LAB — MRSA PCR SCREENING: MRSA by PCR: NEGATIVE

## 2013-12-09 SURGERY — PERMANENT PACEMAKER INSERTION
Anesthesia: LOCAL

## 2013-12-09 MED ORDER — ONDANSETRON HCL 4 MG/2ML IJ SOLN: 4.0000 mg | Freq: Four times a day (QID) | INTRAMUSCULAR | Status: DC | PRN

## 2013-12-09 MED ORDER — CITALOPRAM HYDROBROMIDE 10 MG PO TABS
10.0000 mg | ORAL_TABLET | Freq: Every day | ORAL | Status: DC
  Administered 2013-12-09: 10 mg via ORAL
  Filled 2013-12-09 (×3): qty 1

## 2013-12-09 MED ORDER — LIDOCAINE HCL (PF) 1 % IJ SOLN
INTRAMUSCULAR | Status: AC
  Filled 2013-12-09: qty 30

## 2013-12-09 MED ORDER — CEFAZOLIN SODIUM-DEXTROSE 2-3 GM-% IV SOLR
2.0000 g | INTRAVENOUS | Status: DC
  Filled 2013-12-09: qty 50

## 2013-12-09 MED ORDER — HEPARIN (PORCINE) IN NACL 2-0.9 UNIT/ML-% IJ SOLN
INTRAMUSCULAR | Status: AC
  Filled 2013-12-09: qty 500

## 2013-12-09 MED ORDER — CEFAZOLIN SODIUM 1-5 GM-% IV SOLN
1.0000 g | Freq: Two times a day (BID) | INTRAVENOUS | Status: DC
  Administered 2013-12-09 – 2013-12-10 (×2): 1 g via INTRAVENOUS
  Filled 2013-12-09 (×3): qty 50

## 2013-12-09 MED ORDER — CHLORHEXIDINE GLUCONATE 4 % EX LIQD
60.0000 mL | Freq: Once | CUTANEOUS | Status: AC
  Administered 2013-12-09: 4 via TOPICAL
  Filled 2013-12-09: qty 15

## 2013-12-09 MED ORDER — HYDROCODONE-ACETAMINOPHEN 5-325 MG PO TABS
1.0000 | ORAL_TABLET | ORAL | Status: DC | PRN
  Administered 2013-12-10 (×2): 1 via ORAL
  Filled 2013-12-09 (×2): qty 1

## 2013-12-09 MED ORDER — SODIUM CHLORIDE 0.9 % IV SOLN: 250.0000 mL | INTRAVENOUS | Status: DC | PRN

## 2013-12-09 MED ORDER — SODIUM CHLORIDE 0.9 % IR SOLN
80.0000 mg | Status: DC
  Filled 2013-12-09: qty 2

## 2013-12-09 MED ORDER — SODIUM CHLORIDE 0.9 % IJ SOLN
3.0000 mL | Freq: Two times a day (BID) | INTRAMUSCULAR | Status: DC
  Administered 2013-12-10: 3 mL via INTRAVENOUS

## 2013-12-09 MED ORDER — FUROSEMIDE 20 MG PO TABS
20.0000 mg | ORAL_TABLET | Freq: Every day | ORAL | Status: DC
  Administered 2013-12-09 – 2013-12-10 (×2): 20 mg via ORAL
  Filled 2013-12-09 (×2): qty 1

## 2013-12-09 MED ORDER — ACETAMINOPHEN 325 MG PO TABS
325.0000 mg | ORAL_TABLET | ORAL | Status: DC | PRN
  Administered 2013-12-09: 650 mg via ORAL
  Filled 2013-12-09: qty 2

## 2013-12-09 MED ORDER — GABAPENTIN 100 MG PO CAPS
100.0000 mg | ORAL_CAPSULE | Freq: Two times a day (BID) | ORAL | Status: DC
  Administered 2013-12-09 – 2013-12-10 (×2): 100 mg via ORAL
  Filled 2013-12-09 (×3): qty 1

## 2013-12-09 MED ORDER — ENSURE COMPLETE PO LIQD
237.0000 mL | Freq: Three times a day (TID) | ORAL | Status: DC
  Administered 2013-12-09 – 2013-12-10 (×2): 237 mL via ORAL

## 2013-12-09 MED ORDER — DONEPEZIL HCL 10 MG PO TABS
10.0000 mg | ORAL_TABLET | Freq: Every day | ORAL | Status: DC
  Administered 2013-12-09: 10 mg via ORAL
  Filled 2013-12-09 (×2): qty 1

## 2013-12-09 MED ORDER — SODIUM CHLORIDE 0.9 % IV SOLN
INTRAVENOUS | Status: DC
  Administered 2013-12-09: 14:00:00 via INTRAVENOUS

## 2013-12-09 MED ORDER — SODIUM CHLORIDE 0.9 % IJ SOLN: 3.0000 mL | INTRAMUSCULAR | Status: DC | PRN

## 2013-12-09 MED ORDER — CHLORHEXIDINE GLUCONATE 4 % EX LIQD
60.0000 mL | Freq: Once | CUTANEOUS | Status: AC
  Administered 2013-12-09: 4 via TOPICAL

## 2013-12-09 NOTE — Progress Notes (Signed)
Orthopedic Tech Progress Note Patient Details:  Mary Hartman 09-22-1928 [id number]  Ortho Devices Type of Ortho Device: Arm sling Ortho Device/Splint Location: lue Ortho Device/Splint Interventions: Application   Delcie Ruppert 12/09/2013, 7:12 PM

## 2013-12-09 NOTE — H&P (Signed)
[facility] History and Physical  Jamillah Camilo ZOX:[id number] DOB: [birth year] DOA: 12/09/2013  Referring physician: EDP PCP: Thane Edu, MD   Chief Complaint:   HPI: Mary Hartman is a 79 y.o. female with history of diastolic dysfunction (last ECHO 7/14 EF 65-70%) dementia and chronic back pain who is admitted directly from Dr. Fabio Bering office with extreme bradycardia and RBBB.  On admission she is bradycardic in the 30's and fatigued appearing.  She is accompanied by her daughter who provides the history.  She has been in her usual state of health until two days ago when she had a syncopal event at the [facility].  There was no prodrome, she was participating in a group activity and the next thing she remembers she was sitting in a wheelchair in the [facility].  She denies any similar events in the past.    Review of Systems:  Constitutional: No weight loss, night sweats, Fevers, chills, fatigue.  HEENT: No headaches, Difficulty swallowing,Tooth/dental problems,Sore throat, No sneezing, itching, ear ache, nasal congestion, post nasal drip,  Cardio-vascular: No chest pain, Orthopnea, PND, anasarca, dizziness, palpitations, she does have swelling of the legs  GI: No heartburn, indigestion, abdominal pain, nausea, vomiting, diarrhea, change in bowel habits, loss of appetite  Resp: No shortness of breath with exertion or at rest. No excess mucus, no productive cough, No non-productive cough, No coughing up of blood.No change in color of mucus.No wheezing.No chest wall deformity  Skin: no rash or lesions.  GU: no dysuria, change in color of urine, no urgency or frequency. No flank pain.  Musculoskeletal: No joint pain or swelling. No decreased range of motion. No back pain.  Psych: No change in mood or affect. No depression or anxiety. No memory loss.   Past Medical History  Diagnosis Date  . Osteoporosis   . Osteoarthritis   . Hypercholesteremia   .  Orthopedic aftercare for healing traumatic leg fracture   . Rib fracture   . Osteoarthritis   . Spinal stenosis   . DEMENTIA    Past Surgical History  Procedure Laterality Date  . Hernia repair    . Orthopedic surgery     Social History:  reports that she has never smoked. She has never used smokeless tobacco. She reports that she does not drink alcohol or use illicit drugs.  Allergies  Allergen Reactions  . Lyrica [Pregabalin]     History reviewed. No pertinent family history.   Prior to Admission medications   Medication Sig Start Date End Date Taking? Authorizing Provider  acetaminophen (TYLENOL) 325 MG tablet Take 325 mg by mouth 2 (two) times daily as needed for pain.    Historical Provider, MD  aspirin 81 MG chewable tablet Chew 81 mg by mouth daily.    Historical Provider, MD  Calcium Carb-Cholecalciferol 600-400 MG-UNIT TABS Take 1 tablet by mouth daily.    Historical Provider, MD  citalopram (CELEXA) 10 MG tablet Take 10 mg by mouth at bedtime.    Historical Provider, MD  docusate sodium (COLACE) 100 MG capsule Take 100 mg by mouth 3 (three) times a week. Tuesday, Thursday, Saturday    Historical Provider, MD  donepezil (ARICEPT) 10 MG tablet Take 10 mg by mouth at bedtime.    Historical Provider, MD  furosemide (LASIX) 20 MG tablet Take 1 tablet (20 mg total) by mouth daily. 07/21/13   Kennis Carina, MD  gabapentin (NEURONTIN) 100 MG capsule Take 100 mg by mouth 2 (two) times daily.  Historical Provider, MD  HYDROcodone-acetaminophen (NORCO/VICODIN) 5-325 MG per tablet Take 1 tablet by mouth 3 (three) times daily.    Historical Provider, MD  Multiple Vitamin (MULTIVITAMIN WITH MINERALS) TABS Take 1 tablet by mouth daily.    Historical Provider, MD  Nutritional Supplements (BOOST PO) Take 1 Can by mouth daily.    Historical Provider, MD   Physical Exam: Filed Vitals:   12/09/13 1302  Pulse: 38  Temp: 97 F (36.1 C)    Pulse 38  Temp(Src) 97 F (36.1 C)  (Oral)  Ht 4\' 9"  (1.448 m)  Wt 40.4 kg (89 lb 1.1 oz)  BMI 19.27 kg/m2  SpO2 99%  General appearance: cooperative, fatigued and no distress Head: Normocephalic, without obvious abnormality, atraumatic Throat: lips, mucosa, and tongue normal; teeth and gums normal and mucus membranes dry, edentulous Lungs: shallow resps, poor effort, bibasilar crackles, no wheezes or rhonchi Heart: bradycardic, regular, no mrg Abdomen: soft, non-tender; bowel sounds normal; no masses,  no organomegaly Extremities: trace edema bilaterally MSK: left hand with prior injury fingers deformed, no tender or swollen joints Skin: old scars over both forearms, no rash, lesion Psych: calm Neuro: nonfocal          Labs on Admission:  Basic Metabolic Panel: No results found for this basename: NA, K, CL, CO2, GLUCOSE, BUN, CREATININE, CALCIUM, MG, PHOS,  in the last 168 hours Liver Function Tests: No results found for this basename: AST, ALT, ALKPHOS, BILITOT, PROT, ALBUMIN,  in the last 168 hours No results found for this basename: LIPASE, AMYLASE,  in the last 168 hours No results found for this basename: AMMONIA,  in the last 168 hours CBC: No results found for this basename: WBC, NEUTROABS, HGB, HCT, MCV, PLT,  in the last 168 hours Cardiac Enzymes: No results found for this basename: CKTOTAL, CKMB, CKMBINDEX, TROPONINI,  in the last 168 hours  BNP (last 3 results)  Recent Labs  07/16/13 2305  PROBNP 1069.0*   CBG: No results found for this basename: GLUCAP,  in the last 168 hours  Radiological Exams on Admission: No results found.  EKG: sinus bradycardia with RBBB  Assessment/Plan Active Problems:   Bradycardia   Alzheimer's disease   Protein-calorie malnutrition, severe   Chronic diastolic CHF (congestive heart failure)   Syncope and collapse   1. Symptomatic bradycardia: plan is for pacemaker placement today.  Have discussed with Amber with EP service, see that prepacer orders are  in. 2. Syncope: due to bradycardia 3. alzheimers dementia: stable, patient is at baseline per her daughter. No history of delirium or behavior disturbance. 4. History of diastolic dysfunction with hospitalization for decompensation: currently does appear slightly volume overloaded with bibasilar crackles and trace edema. Will continue to monitor daily weights.   Code Status: FULL Family Communication: spoke with patient and daughter at bedside Disposition Plan: pacer placement today Time spent: 45 minutes  [facility] Pager [phone]

## 2013-12-09 NOTE — Progress Notes (Addendum)
Received call from Dr. Eden Emms regarding this patient. Pt presented to Cardiology for eval for intermittent complete heart block. Feels pt would benefit from pacemaker placement. However, given multiple other chronic medical conditions including CHF, HLD, OA and questionable UTI, Dr. Eden Emms has asked Hospitalist to admit pt. Dr. Eden Emms to discuss case with EP.

## 2013-12-09 NOTE — Consult Note (Signed)
 ELECTROPHYSIOLOGY CONSULT NOTE    Patient ID: Mary Hartman MRN: [id number], DOB/AGE: 03/15/1928 79 y.o.  Admit date: 12/09/2013 Date of Consult: 12-09-13  Primary Physician: KOEHLER,ROBERT NICHOLAS, MD Primary Cardiologist: Peter Nishan, MD  Reason for Consultation: heart block  HPI:  Mrs. Bilotti is a 79 year old female with a past medical history significant for osteoporosis, hypertension, chronic lung disease, and dementia.  She lives with her daughter at home and participates in activities at the [facility] daily.  Her daughter states that for the past couple of weeks she has had decreased energy and has not had as much of an appetite.  She has not complained of chest pain or shortness of breath.  The patient states that she has chronic left sided pain that is related to her arthritis.    She presented to the office today after having a syncopal spell at [facility] 2 days ago.  She was participating in chair volleyball and had a sudden syncopal event.  Her heart rate was in the 30's at the time and she recovered after about 10 minutes.  EKG in the office demonstrated 2:1 heart block with a ventricular rate in the 30's and RBBB.  Last echo was in July of this year and demonstrated an EF of 65-70% and no RWMA.   She is on no rate slowing medications at home and none of her chronic medications have had dosage changes recently. She denies recent fevers, chills, nausea or vomiting.   Labs are pending this admission.   EP has been asked to evaluate for treatment options.  ROS is negative except as outlined above  Past Medical History  Diagnosis Date  . Osteoporosis   . Osteoarthritis   . Hypercholesteremia   . Orthopedic aftercare for healing traumatic leg fracture   . Rib fracture   . Osteoarthritis   . Spinal stenosis   . DEMENTIA      Surgical History:  Past Surgical History  Procedure Laterality Date  . Hernia repair    . Orthopedic surgery       Prescriptions prior to  admission  Medication Sig Dispense Refill  . acetaminophen (TYLENOL) 325 MG tablet Take 325 mg by mouth 2 (two) times daily as needed for pain.      . aspirin 81 MG chewable tablet Chew 81 mg by mouth daily.      . Calcium Carb-Cholecalciferol 600-400 MG-UNIT TABS Take 1 tablet by mouth daily.      . citalopram (CELEXA) 10 MG tablet Take 10 mg by mouth at bedtime.      . docusate sodium (COLACE) 100 MG capsule Take 100 mg by mouth 3 (three) times a week. Tuesday, Thursday, Saturday      . donepezil (ARICEPT) 10 MG tablet Take 10 mg by mouth at bedtime.      . furosemide (LASIX) 20 MG tablet Take 1 tablet (20 mg total) by mouth daily.  30 tablet  2  . gabapentin (NEURONTIN) 100 MG capsule Take 100 mg by mouth 2 (two) times daily.      . HYDROcodone-acetaminophen (NORCO/VICODIN) 5-325 MG per tablet Take 1 tablet by mouth 3 (three) times daily.      . Multiple Vitamin (MULTIVITAMIN WITH MINERALS) TABS Take 1 tablet by mouth daily.      . Nutritional Supplements (BOOST PO) Take 1 Can by mouth daily.        Inpatient Medications:   Allergies:  Allergies  Allergen Reactions  . Lyrica [Pregabalin]       History   Social History  . Marital Status: Widowed    Spouse Name: N/A    Number of Children: N/A  . Years of Education: N/A   Occupational History  . Not on file.   Social History Main Topics  . Smoking status: Never Smoker   . Smokeless tobacco: Never Used  . Alcohol Use: No  . Drug Use: No  . Sexual Activity:    Other Topics Concern  . Not on file   Social History Narrative  . No narrative on file     History reviewed. No pertinent family history.    Labs:   Lab Results  Component Value Date   WBC 5.2 07/20/2013   HGB 11.4* 07/20/2013   HCT 36.0 07/20/2013   MCV 87.2 07/20/2013   PLT 208 07/20/2013   No results found for this basename: NA, K, CL, CO2, BUN, CREATININE, CALCIUM, LABALBU, PROT, BILITOT, ALKPHOS, ALT, AST, GLUCOSE,  in the last 168  hours  Radiology/Studies: Dg Chest 2 View 11/14/2013   CLINICAL DATA:  Shortness of breath, weakness, hemoptysis.  EXAM: CHEST  2 VIEW  COMPARISON:  07/18/2013  FINDINGS: Cardiomegaly. Diffuse coarsened interstitial opacities throughout the lungs, most pronounced in the apices and lung bases. Suspect chronic interstitial lung disease/ fibrosis. Findings are similar to prior study. No definite acute process. No effusions or acute bony abnormality.  IMPRESSION: Severe chronic interstitial lung disease/ fibrosis. No acute findings.  Mild cardiomegaly.   Electronically Signed   By: Kevin  Dover M.D.   On: 11/14/2013 14:28    EKG: 2:1 heart block, ventricular rate 38, RBBB  TELEMETRY: 2:1 heart block, ventricular rate in the 30's  Physical Exam: Filed Vitals:   12/09/13 1302  Pulse: 38  Temp: 97 F (36.1 C)  TempSrc: Oral  Height: 4' 9" (1.448 m)  Weight: 89 lb 1.1 oz (40.4 kg)  SpO2: 99%    GEN- The patient is thin and frail appearing, alert  Head- normocephalic, atraumatic Eyes-  Sclera clear, conjunctiva pink Ears- hearing intact Oropharynx- clear Neck- supple  Lungs- fine rales at the bases, normal work of breathing Heart- very bradycardic GI- soft, NT, ND, + BS Extremities- no clubbing, cyanosis, or edema MS- rheumatic arthritis deformities, diffuse muscle atrophy Skin- no rash or lesion Psych- euthymic mood, full affect Neuro- strength and sensation are intact  Assessment and Plan:  Active Problems:   Alzheimer's disease   Protein-calorie malnutrition, severe   Chronic diastolic CHF (congestive heart failure)   Bradycardia   Syncope and collapse  1.  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 No reversible causes have been identified.  I would therefore recommend pacemaker implantation at this time.  Risks, benefits, alternatives to pacemaker implantation were discussed in detail with the patient today. The patient  understands that the risks include but are not limited to bleeding, infection, pneumothorax, perforation, tamponade, vascular damage, renal failure, MI, stroke, death,  and lead dislodgement and wishes to proceed. We will therefore schedule the procedure at this time.  2. Dementia Stable No change required today  

## 2013-12-09 NOTE — Progress Notes (Signed)
INITIAL NUTRITION ASSESSMENT  DOCUMENTATION CODES Per approved criteria  -Severe malnutrition in the context of chronic illness   INTERVENTION:  Diet advancement per MD after procedure.  Recommend Regular diet with Ensure Complete PO TID.  NUTRITION DIAGNOSIS: Malnutrition related to inadequate oral intake as evidenced by severe depletion of muscle and subcutaneous fat mass.   Goal: Intake to meet >90% of estimated nutrition needs.  Monitor:  Diet advancement, PO intake, labs, weight trend.  Reason for Assessment: MST  79 y.o. female  Admitting Dx: Extreme bradycardia and RBBB  ASSESSMENT: Patient was admitted directly from Dr. Fabio Bering office with extreme bradycardia and RBBB. On admission she is bradycardic in the 30's and fatigued appearing. She has been in her usual state of health until two days ago when she had a syncopal event at the [facility].   Patient reports that she is not a big eater. She likes Ensure/Boost supplements. Per RN, patient's daughter reported that patient has lost ~5 lb in the past few weeks. Per discussion with RN, patient going to have a pacemaker placed today, remains NPO until after procedure.  Pt meets criteria for severe MALNUTRITION in the context of chronic illness as evidenced by severe depletion of muscle and subcutaneous fat mass.  Nutrition Focused Physical Exam:  Subcutaneous Fat:  Orbital Region: severe depletion Upper Arm Region: mild depletion Thoracic and Lumbar Region: NA  Muscle:  Temple Region: severe depletion Clavicle Bone Region: severe depletion Clavicle and Acromion Bone Region: severe depletion Scapular Bone Region: NA Dorsal Hand: mild-moderate depletion Patellar Region: WNL Anterior Thigh Region: WNL Posterior Calf Region: WNL  Edema: none   Height: Ht Readings from Last 1 Encounters:  12/09/13 4\' 9"  (1.448 m)    Weight: Wt Readings from Last 1 Encounters:  12/09/13 89 lb 1.1 oz (40.4 kg)     Ideal Body Weight: 43.2 kg  % Ideal Body Weight: 94%  Wt Readings from Last 10 Encounters:  12/09/13 89 lb 1.1 oz (40.4 kg)  12/09/13 92 lb (41.731 kg)  12/09/13 89 lb 1.1 oz (40.4 kg)  07/21/13 89 lb 1.1 oz (40.4 kg)  03/02/12 97 lb 10.6 oz (44.3 kg)  02/07/12 104 lb (47.174 kg)    Usual Body Weight: 89 lb  % Usual Body Weight: 100%  BMI:  Body mass index is 19.27 kg/(m^2).  Estimated Nutritional Needs: Kcal: 1100-1300 Protein: 60-70 gm Fluid: 1.1-1.3 L  Skin: no wounds  Diet Order: NPO  EDUCATION NEEDS: -Education not appropriate at this time  No intake or output data in the 24 hours ending 12/09/13 1423  Last BM: unknown   Labs:  No results found for this basename: NA, K, CL, CO2, BUN, CREATININE, CALCIUM, MG, PHOS, GLUCOSE,  in the last 168 hours  CBG (last 3)  No results found for this basename: GLUCAP,  in the last 72 hours  Scheduled Meds: .  ceFAZolin (ANCEF) IV  2 g Intravenous On Call  . chlorhexidine  60 mL Topical Once  . chlorhexidine  60 mL Topical Once  . gentamicin irrigation  80 mg Irrigation On Call    Continuous Infusions: . sodium chloride      Past Medical History  Diagnosis Date  . Osteoporosis   . Osteoarthritis   . Hypercholesteremia   . Orthopedic aftercare for healing traumatic leg fracture   . Rib fracture   . Osteoarthritis   . Spinal stenosis   . DEMENTIA     Past Surgical History  Procedure  Laterality Date  . Hernia repair    . Orthopedic surgery      Joaquin Courts, RD, LDN, CNSC Pager [phone] After Hours Pager [phone]

## 2013-12-09 NOTE — Care Management Note (Signed)
    Page 1 of 1   12/09/2013     1:33:29 PM   CARE MANAGEMENT NOTE 12/09/2013  Patient:  Mary Hartman,Mary Hartman   Account Number:  [id number]  Date Initiated:  12/09/2013  Documentation initiated by:  Junius Creamer  Subjective/Objective Assessment:   adm w syncope     Action/Plan:   lives w da, pt has demntia per chart. [facility], pcp dr Marny Lowenstein   Anticipated DC Date:     Anticipated DC Plan:        DC Planning Services  CM consult  [facility]      Choice offered to / List presented to:             Status of service:   Medicare Important Message given?   (If response is "NO", the following Medicare IM given date fields will be blank) Date Medicare IM given:   Date Additional Medicare IM given:    Discharge Disposition:    Per UR Regulation:  Reviewed for med. necessity/level of care/duration of stay  If discussed at Long Length of Stay Meetings, dates discussed:    Comments:  12/12 1316 debbie Fortunata Betty rn,bsn left message w [facility]et pennel [facility] of [facility]'s adm. janet w [facility] called and pt was act with them. they will [facility] back up [facility].

## 2013-12-09 NOTE — Op Note (Signed)
SURGEON:  Hillis Range, MD     PREPROCEDURE DIAGNOSIS:  Symptomatic mobitz II second degree AV block and syncope    POSTPROCEDURE DIAGNOSIS:   Symptomatic mobitz II second degree AV block and syncope     PROCEDURES:   1. Pacemaker implantation.     INTRODUCTION:  Mary Hartman is a 79 y.o. female with a history of symptomatic mobitz II second degree AV block and syncope who presents today for pacemaker implantation.  No reversible causes have been identified.  The patient therefore presents today for pacemaker implantation.     DESCRIPTION OF PROCEDURE:  Informed written consent was obtained, and  the patient was brought to the electrophysiology lab in a fasting state.  The patient required no sedation for the procedure today.  The patients left chest was prepped and draped in the usual sterile fashion by the EP lab staff. The skin overlying the left deltopectoral region was infiltrated with lidocaine for local analgesia.  A 4-cm incision was made over the left deltopectoral region.  A left subcutaneous pacemaker pocket was fashioned using a combination of sharp and blunt dissection. Electrocautery was required to assure hemostasis.     RA/RV Lead Placement: The left axillary vein was cannulated.  No contrast was required for the procedure today.  Through the left axillary vein, a [employer] Isoflex model [phone] (serial number  [id number]) right atrial lead and a [employer] Isoflex model [phone] (serial number  [id number]) right ventricular lead were advanced with fluoroscopic visualization into the right atrial appendage and right ventricular apex positions respectively.  Initial atrial lead P- waves measured 4.9 mV with impedance of 633 ohms and a threshold of 0.6 V at 0.4 msec.  Right ventricular lead R-waves measured 7.2 mV with an impedance of 676 ohms and a threshold of 0.5 V at 0.5 msec.  Both leads were secured to the pectoralis fascia using #2-0 silk over the suture sleeves.    Device Placement:  The leads were then connected to a [facility] Assurity DR model [id number] (serial number  [id number]) pacemaker.  The pocket was irrigated with copious gentamicin solution.  The pacemaker was then placed into the pocket.  The pocket was then closed in 2 layers with 2.0 Vicryl suture for the subcutaneous and subcuticular layers.  Steri-  Strips and a sterile dressing were then applied.  There were no early apparent complications.     CONCLUSIONS:   1. Successful implantation of a [employer] Assurity DR  dual-chamber pacemaker for symptomatic mobitz II second degree AV block and syncope  2. No early apparent complications.           Hillis Range, MD 12/09/2013 5:16 PM

## 2013-12-09 NOTE — Interval H&P Note (Signed)
History and Physical Interval Note:  12/09/2013 2:26 PM  Mary Hartman  has presented today for surgery, with the diagnosis of Heart block  The various methods of treatment have been discussed with the patient and family. After consideration of risks, benefits and other options for treatment, the patient has consented to  Procedure(s): PERMANENT PACEMAKER INSERTION (N/A) as a surgical intervention .  The patient's history has been reviewed, patient examined, no change in status, stable for surgery.  I have reviewed the patient's chart and labs.  Questions were answered to the patient's satisfaction.     Hillis Range

## 2013-12-09 NOTE — H&P (View-Only) (Signed)
ELECTROPHYSIOLOGY CONSULT NOTE    Patient ID: Mary Hartman MRN: [id number], DOB/AGE: 1928/10/26 79 y.o.  Admit date: 12/09/2013 Date of Consult: 12-09-13  Primary Physician: Thane Edu, MD Primary Cardiologist: Charlton Haws, MD  Reason for Consultation: heart block  HPI:  Mary Hartman is a 79 year old female with a past medical history significant for osteoporosis, hypertension, chronic lung disease, and dementia.  She lives with her daughter at home and participates in activities at the [facility] program daily.  Her daughter states that for the past couple of weeks she has had decreased energy and has not had as much of an appetite.  She has not complained of chest pain or shortness of breath.  The patient states that she has chronic left sided pain that is related to her arthritis.    She presented to the office today after having a [facility]opal spell at [facility] days ago.  She was participating in chair volleyball and had a sudden syncopal event.  Her heart rate was in the 30's at the time and she recovered after about 10 minutes.  EKG in the office demonstrated 2:1 heart block with a ventricular rate in the 30's and RBBB.  Last echo was in July of this year and demonstrated an EF of 65-70% and no RWMA.   She is on no rate slowing medications at home and none of her chronic medications have had dosage changes recently. She denies recent fevers, chills, nausea or vomiting.   Labs are pending this admission.   EP has been asked to evaluate for treatment options.  ROS is negative except as outlined above  Past Medical History  Diagnosis Date  . Osteoporosis   . Osteoarthritis   . Hypercholesteremia   . Orthopedic aftercare for healing traumatic leg fracture   . Rib fracture   . Osteoarthritis   . Spinal stenosis   . DEMENTIA      Surgical History:  Past Surgical History  Procedure Laterality Date  . Hernia repair    . Orthopedic surgery       Prescriptions prior to  admission  Medication Sig Dispense Refill  . acetaminophen (TYLENOL) 325 MG tablet Take 325 mg by mouth 2 (two) times daily as needed for pain.      Marland Kitchen aspirin 81 MG chewable tablet Chew 81 mg by mouth daily.      . Calcium Carb-Cholecalciferol 600-400 MG-UNIT TABS Take 1 tablet by mouth daily.      . citalopram (CELEXA) 10 MG tablet Take 10 mg by mouth at bedtime.      . docusate sodium (COLACE) 100 MG capsule Take 100 mg by mouth 3 (three) times a week. Tuesday, Thursday, Saturday      . donepezil (ARICEPT) 10 MG tablet Take 10 mg by mouth at bedtime.      . furosemide (LASIX) 20 MG tablet Take 1 tablet (20 mg total) by mouth daily.  30 tablet  2  . gabapentin (NEURONTIN) 100 MG capsule Take 100 mg by mouth 2 (two) times daily.      Marland Kitchen HYDROcodone-acetaminophen (NORCO/VICODIN) 5-325 MG per tablet Take 1 tablet by mouth 3 (three) times daily.      . Multiple Vitamin (MULTIVITAMIN WITH MINERALS) TABS Take 1 tablet by mouth daily.      . Nutritional Supplements (BOOST PO) Take 1 Can by mouth daily.        Inpatient Medications:   Allergies:  Allergies  Allergen Reactions  . Lyrica [Pregabalin]  History   Social History  . Marital Status: Widowed    Spouse Name: N/A    Number of Children: N/A  . Years of Education: N/A   Occupational History  . Not on file.   Social History Main Topics  . Smoking status: Never Smoker   . Smokeless tobacco: Never Used  . Alcohol Use: No  . Drug Use: No  . Sexual Activity:    Other Topics Concern  . Not on file   Social History Narrative  . No narrative on file     History reviewed. No pertinent family history.    Labs:   Lab Results  Component Value Date   WBC 5.2 07/20/2013   HGB 11.4* 07/20/2013   HCT 36.0 07/20/2013   MCV 87.2 07/20/2013   PLT 208 07/20/2013   No results found for this basename: NA, K, CL, CO2, BUN, CREATININE, CALCIUM, LABALBU, PROT, BILITOT, ALKPHOS, ALT, AST, GLUCOSE,  in the last 168  hours  Radiology/Studies: Dg Chest 2 View 11/14/2013   CLINICAL DATA:  Shortness of breath, weakness, hemoptysis.  EXAM: CHEST  2 VIEW  COMPARISON:  07/18/2013  FINDINGS: Cardiomegaly. Diffuse coarsened interstitial opacities throughout the lungs, most pronounced in the apices and lung bases. Suspect chronic interstitial lung disease/ fibrosis. Findings are similar to prior study. No definite acute process. No effusions or acute bony abnormality.  IMPRESSION: Severe chronic interstitial lung disease/ fibrosis. No acute findings.  Mild cardiomegaly.   Electronically Signed   By: Charlett Nose M.D.   On: 11/14/2013 14:28    EKG: 2:1 heart block, ventricular rate 38, RBBB  TELEMETRY: 2:1 heart block, ventricular rate in the 30's  Physical Exam: Filed Vitals:   12/09/13 1302  Pulse: 38  Temp: 97 F (36.1 C)  TempSrc: Oral  Height: 4\' 9"  (1.448 m)  Weight: 89 lb 1.1 oz (40.4 kg)  SpO2: 99%    GEN- The patient is thin and frail appearing, alert  Head- normocephalic, atraumatic Eyes-  Sclera clear, conjunctiva pink Ears- hearing intact Oropharynx- clear Neck- supple  Lungs- fine rales at the bases, normal work of breathing Heart- very bradycardic GI- soft, NT, ND, + BS Extremities- no clubbing, cyanosis, or edema MS- rheumatic arthritis deformities, diffuse muscle atrophy Skin- no rash or lesion Psych- euthymic mood, full affect Neuro- strength and sensation are intact  Assessment and Plan:  Active Problems:   Alzheimer's disease   Protein-calorie malnutrition, severe   Chronic diastolic CHF (congestive heart failure)   Bradycardia   Syncope and collapse  1.  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 No reversible causes have been identified.  I would therefore recommend pacemaker implantation at this time.  Risks, benefits, alternatives to pacemaker implantation were discussed in detail with the patient today. The patient  understands that the risks include but are not limited to bleeding, infection, pneumothorax, perforation, tamponade, vascular damage, renal failure, MI, stroke, death,  and lead dislodgement and wishes to proceed. We will therefore schedule the procedure at this time.  2. Dementia Stable No change required today

## 2013-12-09 NOTE — Progress Notes (Signed)
Patient ID: Mary Hartman, female   DOB: 26-Apr-1928, 79 y.o.   MRN: [id number]  79 yo referred by Dr Weyman Croon from [facility] for bradycardia  She has dementia.  She is cared for at day center and stays with daughter at night. [place]oved [place] 2 [place]  Histor of HTN elevated lipids ? Diastolic dysfunction In looking at the patient there is a ? Of prevoius stroke Daughter indicates she has always been hard to understand, has no teeth and uses a walker Denies chest pain palpitations.  Had not had syncope up until 2 days ago Had witnessed syncopal spell at [facility] with decreased responsiveness and HR in 30's  Lasted about 10 minutes. Some incontinence  No seizure activity She has a pin in her right ankle and complains of pain in this leg.  Also some right facial pain        ROS: Denies fever, malais, weight loss, blurry vision, decreased visual acuity, cough, sputum, SOB, hemoptysis, pleuritic pain, palpitaitons, heartburn, abdominal pain, melena, lower extremity edema, claudication, or rash.  All other systems reviewed and negative   General: Affect appropriate Thin frail black female  HEENT: edentulous  Neck supple with no adenopathy JVP normal no bruits no thyromegaly Lungs clear with no wheezing and good diaphragmatic motion Heart:  S1/S2 no murmur,rub, gallop or click PMI normal Abdomen: benighn, BS positve, no tenderness, no AAA no bruit.  No HSM or HJR Distal pulses intact with no bruits Mild RLE edema with previous ankle surgery  Neuro non-focal Skin warm and dry Bilateral LE weakness and some tremulousness in UE;s left greater than right   Medications Current Outpatient Prescriptions  Medication Sig Dispense Refill  . acetaminophen (TYLENOL) 325 MG tablet Take 325 mg by mouth 2 (two) times daily as needed for pain.      Marland Kitchen aspirin 81 MG chewable tablet Chew 81 mg by mouth daily.      . Calcium Carb-Cholecalciferol 600-400 MG-UNIT TABS Take 1 tablet by mouth daily.      .  citalopram (CELEXA) 10 MG tablet Take 10 mg by mouth at bedtime.      . donepezil (ARICEPT) 10 MG tablet Take 10 mg by mouth at bedtime.      . furosemide (LASIX) 20 MG tablet Take 1 tablet (20 mg total) by mouth daily.  30 tablet  2  . gabapentin (NEURONTIN) 100 MG capsule Take 100 mg by mouth 2 (two) times daily.      Marland Kitchen HYDROcodone-acetaminophen (NORCO/VICODIN) 5-325 MG per tablet Take 1 tablet by mouth 3 (three) times daily.      . Multiple Vitamin (MULTIVITAMIN WITH MINERALS) TABS Take 1 tablet by mouth daily.      . Nutritional Supplements (BOOST PO) Take 1 Can by mouth daily.      Marland Kitchen docusate sodium (COLACE) 100 MG capsule Take 100 mg by mouth 3 (three) times a week. Tuesday, Thursday, Saturday       No current facility-administered medications for this visit.    Allergies Lyrica  Family History: No family history on file.  Social History: History   Social History  . Marital Status: Widowed    Spouse Name: N/A    Number of Children: N/A  . Years of Education: N/A   Occupational History  . Not on file.   Social History Main Topics  . Smoking status: Never Smoker   . Smokeless tobacco: Never Used  . Alcohol Use: No  . Drug Use: No  .  Sexual Activity:    Other Topics Concern  . Not on file   Social History Narrative  . No narrative on file    Electrocardiogram:  Reviewed one form [facility] 2010 CHB rate 38  RBBB  Today in our office CHB rate 35 RBBB  Assessment and Plan CHB:  With syncope and escape RBBB  Despite age and debility would benefit from pacer.  Will notify EP and admit Under hospitalist for possible PACER Monday if not latter today Echo to assess EF no murmur on exam no clinical History of ischemic event Neuro:  Not clear what is dementia or prevoius stroke Edentulousness makes speech garbled Consider neuro consult  Will need PT/OT and possible rehab  With incontinence would check UA before considering pacer placement Ortho:  Mild pain to palpation of  right ankle Can weight bear Dont think she needs Xray at this time   Hospitalist kind enough to admit.  Will talk with Dr Johney Frame and Graciela Husbands about pacer

## 2013-12-10 ENCOUNTER — Inpatient Hospital Stay (HOSPITAL_COMMUNITY): Payer: Medicare (Managed Care)

## 2013-12-10 DIAGNOSIS — I441 Atrioventricular block, second degree: Secondary | ICD-10-CM | POA: Diagnosis present

## 2013-12-10 LAB — BASIC METABOLIC PANEL
BUN: 20 mg/dL (ref 6–23)
Calcium: 8.5 mg/dL (ref 8.4–10.5)
Creatinine, Ser: 1.07 mg/dL (ref 0.50–1.10)
GFR calc non Af Amer: 47 mL/min — ABNORMAL LOW (ref >90)
Glucose, Bld: 79 mg/dL (ref 70–99)

## 2013-12-10 NOTE — Progress Notes (Signed)
SUBJECTIVE: The patient is doing well today.  At this time, she denies chest pain, shortness of breath, or any new concerns.  Marland Kitchen  ceFAZolin (ANCEF) IV  1 g Intravenous Q12H  . citalopram  10 mg Oral QHS  . donepezil  10 mg Oral QHS  . feeding supplement (ENSURE COMPLETE)  237 mL Oral TID BM  . furosemide  20 mg Oral Daily  . gabapentin  100 mg Oral BID  . sodium chloride  3 mL Intravenous Q12H      OBJECTIVE: Physical Exam: Filed Vitals:   12/10/13 0050 12/10/13 0250 12/10/13 0443 12/10/13 0500  BP: 180/85 167/86 185/83 158/74  Pulse: 110 105 75   Temp:   98 F (36.7 C)   TempSrc:   Oral   Resp:   20   Height:      Weight:      SpO2:   97%     Intake/Output Summary (Last 24 hours) at 12/10/13 4782 Last data filed at 12/09/13 2300  Gross per 24 hour  Intake    100 ml  Output      3 ml  Net     97 ml    Telemetry reveals sinus rhythm with V pacing  GEN- The patient is chronically ill appearing, alert and oriented x 3 today.   Head- normocephalic, atraumatic Eyes-  Sclera clear, conjunctiva pink Ears- hearing intact Oropharynx- clear Neck- supple  Lungs- Clear to ausculation bilaterally, normal work of breathing Heart- Regular rate and rhythm  GI- soft, NT, ND, + BS Extremities- no clubbing, cyanosis, or edema Skin- pacemaker site is without hematoma  LABS: Basic Metabolic Panel:  Recent Labs  95/62/13 1400 12/10/13 0450  NA 143 142  K 4.6 4.7  CL 109 109  CO2 26 22  GLUCOSE 87 79  BUN 22 20  CREATININE 1.26* 1.07  CALCIUM 8.9 8.5   Liver Function Tests: No results found for this basename: AST, ALT, ALKPHOS, BILITOT, PROT, ALBUMIN,  in the last 72 hours No results found for this basename: LIPASE, AMYLASE,  in the last 72 hours CBC:  Recent Labs  12/09/13 1400  WBC 5.3  HGB 12.0  HCT 36.9  MCV 88.9  PLT 189   Cardiac Enzymes: No results found for this basename: CKTOTAL, CKMB, CKMBINDEX, TROPONINI,  in the last 72 hours BNP: No  components found with this basename: POCBNP,  D-Dimer: No results found for this basename: DDIMER,  in the last 72 hours Hemoglobin A1C: No results found for this basename: HGBA1C,  in the last 72 hours Fasting Lipid Panel: No results found for this basename: CHOL, HDL, LDLCALC, TRIG, CHOLHDL, LDLDIRECT,  in the last 72 hours Thyroid Function Tests: No results found for this basename: TSH, T4TOTAL, FREET3, T3FREE, THYROIDAB,  in the last 72 hours Anemia Panel: No results found for this basename: VITAMINB12, FOLATE, FERRITIN, TIBC, IRON, RETICCTPCT,  in the last 72 hours  RADIOLOGY: Dg Chest 2 View  12/10/2013   CLINICAL DATA:  Status post pacemaker placement  EXAM: CHEST  2 VIEW  COMPARISON:  12/09/2013  FINDINGS: A pacing device is now seen. No pneumothorax is noted. The cardiac shadow is stable but mildly enlarged. Diffuse interstitial changes are again noted bilaterally. A somewhat nodular density is noted overlying the left lung base likely related to a nipple shadow is was not seen on the previous exam. Similar density is noted on the right.  IMPRESSION: No evidence of pneumothorax.  Bilateral nipple shadows.  Electronically Signed   By: Alcide Clever M.D.   On: 12/10/2013 07:52   Dg Chest 2 View  11/14/2013   CLINICAL DATA:  Shortness of breath, weakness, hemoptysis.  EXAM: CHEST  2 VIEW  COMPARISON:  07/18/2013  FINDINGS: Cardiomegaly. Diffuse coarsened interstitial opacities throughout the lungs, most pronounced in the apices and lung bases. Suspect chronic interstitial lung disease/ fibrosis. Findings are similar to prior study. No definite acute process. No effusions or acute bony abnormality.  IMPRESSION: Severe chronic interstitial lung disease/ fibrosis. No acute findings.  Mild cardiomegaly.   Electronically Signed   By: Charlett Nose M.D.   On: 11/14/2013 14:28   Dg Chest Port 1 View  12/09/2013   CLINICAL DATA:  Preop evaluation.  EXAM: PORTABLE CHEST - 1 VIEW  COMPARISON:   11/14/2013  FINDINGS: The cardiac silhouette is enlarged. Continued diffuse prominence of interstitial markings appreciated. Areas of bronchiectasis within the right and left lung bases. No focal region of consolidation identified. Degenerative changes identified within the right shoulder.  IMPRESSION: Severe interstitial changes with skin appreciated consistent with interstitial fibrosis. No focal regions of consolidation appreciated.   Electronically Signed   By: Salome Holmes M.D.   On: 12/09/2013 14:10    ASSESSMENT AND PLAN:  Active Problems:   Alzheimer's disease   Protein-calorie malnutrition, severe   Chronic diastolic CHF (congestive heart failure)   Bradycardia   Syncope and collapse   Second degree Mobitz II AV block   Mobitz type 2 second degree AV block  1. Mobitz II second degree AV block with syncope Doing well s/p PPM.  She is now device dependant. CXR reveals no ptx and stable leads Interrogation is reviewed in detail and reveals normal device function  Routine wound care and follow-up Wound check in 10 days in my office I will see in 3 months  OK to discharge from EP standpoint as long as other medical issues are stable. Please call with questions.   Hillis Range, MD 12/10/2013 8:33 AM

## 2013-12-10 NOTE — Discharge Summary (Addendum)
DISCHARGE SUMMARY  Vashon Arch  MR#: [id number]  DOB:1928-09-27  Date of Admission: 12/09/2013 Date of Discharge: 12/10/2013  Attending Physician:Dajanique Robley T  Patient's WUJ:WJXBJYN,WGNFAO Janyth Pupa, MD  Consults: Perlie Mayo EP - Dr. Johney Frame  Disposition: D/C home   Follow-up Appts:     Follow-up Information   Follow up with Hillis Range, MD On 12/21/2013. (Device clinic wound check - 9:00AM )    Specialty:  Cardiology   Contact information:   [address] [place] [address] [phone]       Follow up with Hillis Range, MD On 03/20/2014. (11:15AM - office vist with Dr. Johney Frame)    Specialty:  Cardiology   Contact information:   [address] [place] [address] [phone]      Discharge Diagnoses: Symptomatic mobitz II second degree AV block and syncope - s/p pacer insertion Dementia / Alz disease Chronic diastolic CHF  Initial presentation: 79 y.o. female with history of diastolic dysfunction (last ECHO 7/14 EF 65-70%) dementia and chronic back pain who was admitted directly from Dr. Fabio Bering office with extreme bradycardia and RBBB. On admission she was bradycardic in the 30's and fatigued. She had been in her usual state of health until two days prior to her admission when she [facility]. There was no prodrome, she was participating in a group activity and the next thing she remembers she was sitting in a wheelchair in the [facility]. She denied any similar events in the past.   Hospital Course:  Symptomatic mobitz II second degree AV block and syncope Now s/p pacemaker implantation - doing well post insertion with no persisting complaints - cleared for d/c home by EP   Dementia / Alz disease Stable   Chronic diastolic CHF Well compensated     Medication List         acetaminophen 325 MG tablet  Commonly known as:  TYLENOL  Take 325 mg by mouth 2 (two) times daily as needed  for pain.     aspirin 81 MG chewable tablet  Chew 81 mg by mouth daily.     BOOST PO  Take 1 Can by mouth daily.     Calcium Carb-Cholecalciferol 600-400 MG-UNIT Tabs  Take 1 tablet by mouth daily.     citalopram 10 MG tablet  Commonly known as:  CELEXA  Take 10 mg by mouth at bedtime.     donepezil 10 MG tablet  Commonly known as:  ARICEPT  Take 10 mg by mouth at bedtime.     furosemide 20 MG tablet  Commonly known as:  LASIX  Take 1 tablet (20 mg total) by mouth daily.     gabapentin 100 MG capsule  Commonly known as:  NEURONTIN  Take 100 mg by mouth 2 (two) times daily.     HYDROcodone-acetaminophen 5-325 MG per tablet  Commonly known as:  NORCO/VICODIN  Take 1 tablet by mouth 3 (three) times daily.     lidocaine 5 %  Commonly known as:  LIDODERM  Place 1 patch onto the skin daily. Remove & Discard patch within 12 hours or as directed by MD     menthol-thymol Liqd  Apply 1 application topically every 8 (eight) hours as needed (for pain).     multivitamin with minerals Tabs tablet  Take 1 tablet by mouth daily.       Day of Discharge BP 132/71  Pulse 68  Temp(Src) 98.1 F (  36.7 C) (Oral)  Resp 18  Ht 4\' 9"  (1.448 m)  Wt 40.4 kg (89 lb 1.1 oz)  BMI 19.27 kg/m2  SpO2 99%  Physical Exam: General: No acute respiratory distress Lungs: Clear to auscultation bilaterally without wheezes or crackles Cardiovascular: Regular rate and rhythm without murmur gallop or rub normal S1 and S2 Abdomen: Nontender, nondistended, soft, bowel sounds positive, no rebound, no ascites, no appreciable mass Extremities: No significant cyanosis, clubbing, or edema bilateral lower extremities  Results for orders placed during the hospital encounter of 12/09/13 (from the past 24 hour(s))  BASIC METABOLIC PANEL     Status: Abnormal   Collection Time    12/10/13  4:50 AM      Result Value Range   Sodium 142  135 - 145 mEq/L   Potassium 4.7  3.5 - 5.1 mEq/L   Chloride 109  96 -  112 mEq/L   CO2 22  19 - 32 mEq/L   Glucose, Bld 79  70 - 99 mg/dL   BUN 20  6 - 23 mg/dL   Creatinine, Ser 1.61  0.50 - 1.10 mg/dL   Calcium 8.5  8.4 - 09.6 mg/dL   GFR calc non Af Amer 47 (*) >90 mL/min   GFR calc Af Amer 54 (*) >90 mL/min    Time spent in discharge (includes decision making & examination of pt): 30 minutes  12/10/2013, 2:53 PM   Lonia Blood, MD [facility] Office  [phone] Pager [phone]  On-Call/Text Page:      [email or web address]      password [facility]

## 2013-12-11 ENCOUNTER — Encounter (HOSPITAL_COMMUNITY): Payer: Self-pay | Admitting: Emergency Medicine

## 2013-12-11 ENCOUNTER — Emergency Department (HOSPITAL_COMMUNITY)
Admission: EM | Admit: 2013-12-11 | Discharge: 2013-12-11 | Disposition: A | Discharge status: Home / Self Care | Payer: Medicare (Managed Care) | Attending: Emergency Medicine | Admitting: Emergency Medicine

## 2013-12-11 DIAGNOSIS — Y831 Surgical operation with implant of artificial internal device as the cause of abnormal reaction of the patient, or of later complication, without mention of misadventure at the time of the procedure: Secondary | ICD-10-CM | POA: Insufficient documentation

## 2013-12-11 DIAGNOSIS — Z8639 Personal history of other endocrine, nutritional and metabolic disease: Secondary | ICD-10-CM | POA: Insufficient documentation

## 2013-12-11 DIAGNOSIS — IMO0002 Reserved for concepts with insufficient information to code with codable children: Secondary | ICD-10-CM | POA: Insufficient documentation

## 2013-12-11 DIAGNOSIS — M1990 Unspecified osteoarthritis, unspecified site: Secondary | ICD-10-CM | POA: Insufficient documentation

## 2013-12-11 DIAGNOSIS — Z79899 Other long term (current) drug therapy: Secondary | ICD-10-CM | POA: Insufficient documentation

## 2013-12-11 DIAGNOSIS — T888XXA Other specified complications of surgical and medical care, not elsewhere classified, initial encounter: Secondary | ICD-10-CM

## 2013-12-11 DIAGNOSIS — Z950 Presence of cardiac pacemaker: Secondary | ICD-10-CM | POA: Insufficient documentation

## 2013-12-11 DIAGNOSIS — Z862 Personal history of diseases of the blood and blood-forming organs and certain disorders involving the immune mechanism: Secondary | ICD-10-CM | POA: Insufficient documentation

## 2013-12-11 DIAGNOSIS — M810 Age-related osteoporosis without current pathological fracture: Secondary | ICD-10-CM | POA: Insufficient documentation

## 2013-12-11 DIAGNOSIS — Z7982 Long term (current) use of aspirin: Secondary | ICD-10-CM | POA: Insufficient documentation

## 2013-12-11 DIAGNOSIS — I509 Heart failure, unspecified: Secondary | ICD-10-CM | POA: Insufficient documentation

## 2013-12-11 DIAGNOSIS — F0390 Unspecified dementia without behavioral disturbance: Secondary | ICD-10-CM | POA: Insufficient documentation

## 2013-12-11 DIAGNOSIS — Z8781 Personal history of (healed) traumatic fracture: Secondary | ICD-10-CM | POA: Insufficient documentation

## 2013-12-11 HISTORY — DX: Heart failure, unspecified: I50.9

## 2013-12-11 NOTE — ED Provider Notes (Signed)
CSN: [id number]     Arrival date & time 12/11/13  1143 History   First MD Initiated Contact with Patient 12/11/13 1153     Chief Complaint  Patient presents with  . Bleeding/Bruising   (Consider location/radiation/quality/duration/timing/severity/associated sxs/prior Treatment) The history is provided by the patient and medical records.   This is an 79 year old female with past history significant for congestive heart failure, dementia, osteoarthritis, presenting to the ED for bleeding from her pacemaker site. Patient had [employer] pacemaker device implanted on Friday 12/09/2013 for Mobitz type II heart block by Dr. Hillis Range. Procedure went well without noted complication. She was discharged last night-- daughter notes approx a quarter sized spot of blood on her bandage.  Upon waking this morning bandage was soaked with blood.  Daughter has found pt moving arm more than recommended.  Pt states she some some pain at insertion site but denies any underlying chest pain or shortness of breath.  No fevers, sweats, or chills.  Daughter states mental status is at baseline.  Pt on daily ASA, no other anti-coagulants at this time.  Past Medical History  Diagnosis Date  . Osteoporosis   . Osteoarthritis   . Hypercholesteremia   . Orthopedic aftercare for healing traumatic leg fracture   . Rib fracture   . Osteoarthritis   . Spinal stenosis   . DEMENTIA   . CHF (congestive heart failure)    Past Surgical History  Procedure Laterality Date  . Hernia repair    . Orthopedic surgery     No family history on file. History  Substance Use Topics  . Smoking status: Never Smoker   . Smokeless tobacco: Never Used  . Alcohol Use: No   OB History   Grav Para Term Preterm Abortions TAB SAB Ect Mult Living                 Review of Systems  Skin: Positive for wound.       Post-op problem  All other systems reviewed and are negative.    Allergies  Lyrica  Home Medications   Current  Outpatient Rx  Name  Route  Sig  Dispense  Refill  . acetaminophen (TYLENOL) 325 MG tablet   Oral   Take 325 mg by mouth 2 (two) times daily as needed for pain.         Marland Kitchen aspirin 81 MG chewable tablet   Oral   Chew 81 mg by mouth daily.         . Calcium Carb-Cholecalciferol 600-400 MG-UNIT TABS   Oral   Take 1 tablet by mouth daily.         . citalopram (CELEXA) 10 MG tablet   Oral   Take 10 mg by mouth at bedtime.         . donepezil (ARICEPT) 10 MG tablet   Oral   Take 10 mg by mouth at bedtime.         . furosemide (LASIX) 20 MG tablet   Oral   Take 1 tablet (20 mg total) by mouth daily.   30 tablet   2   . gabapentin (NEURONTIN) 100 MG capsule   Oral   Take 100 mg by mouth 2 (two) times daily.         Marland Kitchen HYDROcodone-acetaminophen (NORCO/VICODIN) 5-325 MG per tablet   Oral   Take 1 tablet by mouth 3 (three) times daily.         Marland Kitchen lidocaine (LIDODERM) 5 %  Transdermal   Place 1 patch onto the skin daily. Remove & Discard patch within 12 hours or as directed by MD         . menthol-thymol (ABSORBINE JR) LIQD   Topical   Apply 1 application topically every 8 (eight) hours as needed (for pain).         . Multiple Vitamin (MULTIVITAMIN WITH MINERALS) TABS   Oral   Take 1 tablet by mouth daily.         . Nutritional Supplements (BOOST PO)   Oral   Take 1 Can by mouth daily.          BP 116/67  Pulse 104  Temp(Src) 97.8 F (36.6 C) (Oral)  Resp 20  Ht 4\' 9"  (1.448 m)  Wt 92 lb (41.731 kg)  BMI 19.90 kg/m2  SpO2 95%  Physical Exam  Nursing note and vitals reviewed. Constitutional: She is oriented to person, place, and time. She appears well-developed and well-nourished.  HENT:  Head: Normocephalic and atraumatic.  Mouth/Throat: Oropharynx is clear and moist.  Eyes: Conjunctivae and EOM are normal. Pupils are equal, round, and reactive to light.  Neck: Normal range of motion.  Cardiovascular: Normal rate, regular rhythm and  normal heart sounds.   Pulmonary/Chest: Effort normal and breath sounds normal. No respiratory distress. She has no wheezes.  Pacemaker insertion site of left chest wall with large overlying clot originating from middle of surgical incision; no active bleeding; no surrounding erythema, induration, purulent drainage, or signs of infection; skin remains well approximated  Abdominal: Soft. Bowel sounds are normal.  Musculoskeletal: Normal range of motion.  Neurological: She is alert and oriented to person, place, and time.  Skin: Skin is warm and dry.  Psychiatric: She has a normal mood and affect.    ED Course  Procedures (including critical care time) Labs Review Labs Reviewed - No data to display Imaging Review Dg Chest 2 View  12/10/2013   CLINICAL DATA:  Status post pacemaker placement  EXAM: CHEST  2 VIEW  COMPARISON:  12/09/2013  FINDINGS: A pacing device is now seen. No pneumothorax is noted. The cardiac shadow is stable but mildly enlarged. Diffuse interstitial changes are again noted bilaterally. A somewhat nodular density is noted overlying the left lung base likely related to a nipple shadow is was not seen on the previous exam. Similar density is noted on the right.  IMPRESSION: No evidence of pneumothorax.  Bilateral nipple shadows.   Electronically Signed   By: Alcide Clever M.D.   On: 12/10/2013 07:52   Dg Chest Port 1 View  12/09/2013   CLINICAL DATA:  Preop evaluation.  EXAM: PORTABLE CHEST - 1 VIEW  COMPARISON:  11/14/2013  FINDINGS: The cardiac silhouette is enlarged. Continued diffuse prominence of interstitial markings appreciated. Areas of bronchiectasis within the right and left lung bases. No focal region of consolidation identified. Degenerative changes identified within the right shoulder.  IMPRESSION: Severe interstitial changes with skin appreciated consistent with interstitial fibrosis. No focal regions of consolidation appreciated.   Electronically Signed   By: Salome Holmes M.D.   On: 12/09/2013 14:10    EKG Interpretation   None       MDM   1. Post-op bleeding, initial encounter   2. Pacemaker    Wound undressed with large overlying clot present without active bleeding, skin remains well approximated without signs of infection.  Dr. Johney Frame personally evaluated pt in the ED, expressed no concerns.  He redressed wound  and has arranged office FU for Tuesday 12/13/13.  Instructed pt to continue all medications and wearing sling as directed.  Strict return precautions advised for new or worsening symptoms.  Garlon Hatchet, PA-C 12/11/13 1315

## 2013-12-11 NOTE — ED Provider Notes (Signed)
Medical screening examination/treatment/procedure(s) were conducted as a shared visit with non-physician practitioner(s) and myself.  I personally evaluated the patient during the encounter.  EKG Interpretation   None       See separate note.  Shon Baton, MD 12/11/13 [phone]

## 2013-12-11 NOTE — ED Notes (Signed)
PA at bedside cleaning and dressing site.

## 2013-12-11 NOTE — ED Notes (Addendum)
Family reports that pt has bleeding coming from pacemaker site on left chest. Pt has pacemaker inserted on Friday afternoon. Family reports that her Dr wanted her to stay overnight for 48 hours but pt was released last night. Dressing from when pacemaker was placed remains on and is saturated with blood.

## 2013-12-11 NOTE — Progress Notes (Signed)
Called for sanguinous drainage from device incision. Steri-strips have already been removed by ED staff.  Pocket looks good with very minor hematoma.  New steri-strips placed by me using sterile technique Pressure dressing applied.  She will return to the device clinic on Tuesday for follow-up Keep pressure dressing in place until then.  No further workup required at this point.

## 2013-12-11 NOTE — ED Provider Notes (Signed)
Medical screening examination/treatment/procedure(s) were conducted as a shared visit with non-physician practitioner(s) and myself.  I personally evaluated the patient during the encounter.  EKG Interpretation   None      Patient presents with bleeding from a surgical site. Patient had a pacemaker placed on Friday.  Patient's daughter reports that she was discharged yesterday and overnight had bleeding from her wound. The bandage was soaked. Patient does take an aspirin but no other blood thinners.  Overlying dressing soaked with blood. Upon removal, clot noted underneath Steri-Strips without active bleeding. Steri-Strips removed and wound appears clean and intact.  Dr. Johney Frame examined and redressed wound with pressure dressing.  Patient to follow-up on Tuesday.  Continue ASA per Dr. Johney Frame.  Shon Baton, MD 12/11/13 [phone]

## 2013-12-12 ENCOUNTER — Encounter (HOSPITAL_COMMUNITY): Payer: Self-pay | Admitting: *Deleted

## 2013-12-13 ENCOUNTER — Ambulatory Visit (INDEPENDENT_AMBULATORY_CARE_PROVIDER_SITE_OTHER): Payer: Medicare (Managed Care) | Admitting: *Deleted

## 2013-12-13 DIAGNOSIS — I441 Atrioventricular block, second degree: Secondary | ICD-10-CM

## 2013-12-13 NOTE — Progress Notes (Signed)
Pt in for wound recheck due to bleeding. Pt was seen in hospital on 12-11-13 by Dr Johney Frame and pressure dressing was applied for bleeding. Pressure dressing removed and very little to zero dried blood on steri strips. Pt instructed to exercise left arm as pt has not been doing so. Pt scheduled for wound check on 12-21-13 and daughter is aware of appointment.

## 2013-12-21 ENCOUNTER — Encounter: Payer: Self-pay | Admitting: Internal Medicine

## 2013-12-21 ENCOUNTER — Ambulatory Visit (INDEPENDENT_AMBULATORY_CARE_PROVIDER_SITE_OTHER): Payer: Medicare (Managed Care) | Admitting: *Deleted

## 2013-12-21 DIAGNOSIS — I442 Atrioventricular block, complete: Secondary | ICD-10-CM

## 2013-12-21 LAB — MDC_IDC_ENUM_SESS_TYPE_INCLINIC
Battery Remaining Longevity: 98.4 mo
Battery Voltage: 3.04 V
Brady Statistic RA Percent Paced: 2.9 %
Brady Statistic RV Percent Paced: 99.93 %
Lead Channel Impedance Value: 487.5 Ohm
Lead Channel Pacing Threshold Pulse Width: 0.4 ms
Lead Channel Sensing Intrinsic Amplitude: 3.9 mV
Lead Channel Setting Pacing Amplitude: 3.5 V
Lead Channel Setting Pacing Amplitude: 3.5 V
Lead Channel Setting Pacing Pulse Width: 0.4 ms
Lead Channel Setting Sensing Sensitivity: 4 mV

## 2013-12-21 NOTE — Progress Notes (Signed)
Wound check appointment. Steri-strips removed. Wound without redness, edema noted.   Incision edges approximated, wound well healed. Normal device function. Thresholds, sensing, and impedances consistent with implant measurements.  Noise noted on ventricular lead but R--wave, threhold and impedance are stable.  Device programmed at 3.5V/auto capture programmed on for extra safety margin until 3 month visit. Histogram distribution appropriate for patient and level of activity. 5 mode switches all PAC's.  No high ventricular rates noted. Patient educated about wound care, arm mobility, lifting restrictions. ROV in 3 months with implanting physician.

## 2013-12-30 ENCOUNTER — Ambulatory Visit: Payer: No Typology Code available for payment source | Admitting: Internal Medicine

## 2014-01-05 ENCOUNTER — Ambulatory Visit: Payer: No Typology Code available for payment source | Admitting: Internal Medicine

## 2014-03-20 ENCOUNTER — Encounter: Payer: Self-pay | Admitting: Internal Medicine

## 2014-03-20 ENCOUNTER — Ambulatory Visit (HOSPITAL_COMMUNITY)
Admission: RE | Admit: 2014-03-20 | Discharge: 2014-03-20 | Disposition: A | Discharge status: Home / Self Care | Payer: Medicare (Managed Care) | Source: Ambulatory Visit | Attending: Internal Medicine | Admitting: Internal Medicine

## 2014-03-20 ENCOUNTER — Ambulatory Visit (INDEPENDENT_AMBULATORY_CARE_PROVIDER_SITE_OTHER): Payer: Medicare (Managed Care) | Admitting: Internal Medicine

## 2014-03-20 VITALS — BP 121/74 | HR 67 | Ht <= 58 in | Wt 87.2 lb

## 2014-03-20 DIAGNOSIS — T82110A Breakdown (mechanical) of cardiac electrode, initial encounter: Secondary | ICD-10-CM

## 2014-03-20 DIAGNOSIS — I5032 Chronic diastolic (congestive) heart failure: Secondary | ICD-10-CM

## 2014-03-20 DIAGNOSIS — I441 Atrioventricular block, second degree: Secondary | ICD-10-CM

## 2014-03-20 DIAGNOSIS — I509 Heart failure, unspecified: Secondary | ICD-10-CM

## 2014-03-20 DIAGNOSIS — J8410 Pulmonary fibrosis, unspecified: Secondary | ICD-10-CM | POA: Insufficient documentation

## 2014-03-20 DIAGNOSIS — J438 Other emphysema: Secondary | ICD-10-CM | POA: Insufficient documentation

## 2014-03-20 DIAGNOSIS — T82190A Other mechanical complication of cardiac electrode, initial encounter: Secondary | ICD-10-CM

## 2014-03-20 NOTE — Patient Instructions (Signed)
Your physician wants you to follow-up in: 9 months with Dr Jacquiline DoeAllred You will receive a reminder letter in the mail two months in advance. If you don't receive a letter, please call our office to schedule the follow-up appointment.  Remote monitoring is used to monitor your Pacemaker of ICD from home. This monitoring reduces the number of office visits required to check your device to one time per year. It allows us to keep an eye on the functioning of your device to ensure it is working properly. You are scheduled for a device check from home on 06/20/14. You may send your transmission at any time that day. If you have a wireless device, the transmission will be sent automatically. After your physician reviews your transmission, you will receive a postcard with your next transmission date.   A chest x-ray takes a picture of the organs and structures inside the chest, including the heart, lungs, and blood vessels. This test can show several things, including, whether the heart is enlarges; whether fluid is building up in the lungs; and whether pacemaker / defibrillator leads are still in place.--- [facility]

## 2014-03-21 ENCOUNTER — Encounter: Payer: Self-pay | Admitting: Internal Medicine

## 2014-03-21 ENCOUNTER — Telehealth: Payer: Self-pay | Admitting: Internal Medicine

## 2014-03-21 DIAGNOSIS — I441 Atrioventricular block, second degree: Secondary | ICD-10-CM | POA: Insufficient documentation

## 2014-03-21 LAB — MDC_IDC_ENUM_SESS_TYPE_INCLINIC
Battery Voltage: 2.98 V
Brady Statistic RA Percent Paced: 4.8 %
Brady Statistic RV Percent Paced: 99.96 %
Date Time Interrogation Session: 20150323153738
Implantable Pulse Generator Serial Number: 7569773
Lead Channel Pacing Threshold Amplitude: 0.5 V
Lead Channel Pacing Threshold Amplitude: 1.5 V
Lead Channel Pacing Threshold Pulse Width: 0.4 ms
Lead Channel Pacing Threshold Pulse Width: 0.7 ms
Lead Channel Sensing Intrinsic Amplitude: 6.7 mV
Lead Channel Setting Pacing Amplitude: 2.5 V
Lead Channel Setting Pacing Pulse Width: 0.4 ms
Lead Channel Setting Sensing Sensitivity: 3 mV
MDC IDC MSMT BATTERY REMAINING LONGEVITY: 110.4 mo
MDC IDC MSMT LEADCHNL RA IMPEDANCE VALUE: 562.5 Ohm
MDC IDC MSMT LEADCHNL RA SENSING INTR AMPL: 3.9 mV
MDC IDC MSMT LEADCHNL RV IMPEDANCE VALUE: 750 Ohm
MDC IDC MSMT LEADCHNL RV PACING THRESHOLD AMPLITUDE: 0.5 V
MDC IDC MSMT LEADCHNL RV PACING THRESHOLD PULSEWIDTH: 0.4 ms
MDC IDC SET LEADCHNL RA PACING AMPLITUDE: 2.5 V

## 2014-03-21 NOTE — Progress Notes (Signed)
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 female who presents today for routine electrophysiology followup.  Since having her pacemaker implanted, the patient reports doing very well.  Today, she denies symptoms of palpitations, chest pain, shortness of breath,  lower extremity edema, dizziness, presyncope, or syncope.  The patient is otherwise without complaint today.   Past Medical History  Diagnosis Date  . Osteoporosis   . Hypercholesteremia   . Orthopedic aftercare for healing traumatic leg fracture   . Rib fracture   . Osteoarthritis   . Spinal stenosis   . DEMENTIA   . CHF (congestive heart failure)   . Mobitz II 11-2013    s/p pacemaker   Past Surgical History  Procedure Laterality Date  . Hernia repair    . Orthopedic surgery    . Pacemaker insertion  12-09-2013    STJ Assurity dual chamber pacemaker placed by Dr Johney Frame    Current Outpatient Prescriptions  Medication Sig Dispense Refill  . acetaminophen (TYLENOL) 325 MG tablet Take 325 mg by mouth 2 (two) times daily as needed for pain.      Marland Kitchen aspirin 81 MG chewable tablet Chew 81 mg by mouth daily.      . Calcium Carb-Cholecalciferol 600-400 MG-UNIT TABS Take 1 tablet by mouth daily.      . citalopram (CELEXA) 10 MG tablet Take 10 mg by mouth at bedtime.      . donepezil (ARICEPT) 10 MG tablet Take 10 mg by mouth at bedtime.      . furosemide (LASIX) 20 MG tablet Take 1 tablet (20 mg total) by mouth daily.  30 tablet  2  . gabapentin (NEURONTIN) 100 MG capsule Take 100 mg by mouth 2 (two) times daily.      Marland Kitchen HYDROcodone-acetaminophen (NORCO/VICODIN) 5-325 MG per tablet Take 1 tablet by mouth 3 (three) times daily.      Marland Kitchen lidocaine (LIDODERM) 5 % Place 1 patch onto the skin daily. Remove & Discard patch within 12 hours or as directed by MD      . menthol-thymol (ABSORBINE JR) LIQD Apply 1 application topically every 8 (eight) hours as needed (for pain).      .  Multiple Vitamin (MULTIVITAMIN WITH MINERALS) TABS Take 1 tablet by mouth daily.      . Nutritional Supplements (BOOST PO) Take 1 Can by mouth daily.       No current facility-administered medications for this visit.    Physical Exam: Filed Vitals:   03/20/14 1130  BP: 121/74  Pulse: 67  Height: 4\' 5"  (1.346 m)  Weight: 87 lb 3.2 oz (39.554 kg)    GEN- The patient is elderly and frail appearing, alert and oriented x 3 today.   Head- normocephalic, atraumatic Eyes-  Sclera clear, conjunctiva pink Ears- hearing intact Oropharynx- clear Lungs- Clear to ausculation bilaterally, normal work of breathing Chest- pacemaker pocket is well healed Heart- Regular rate and rhythm, no murmurs, rubs or gallops, PMI not laterally displaced GI- soft, NT, ND, + BS Extremities- no clubbing, cyanosis, or edema  Pacemaker interrogation- reviewed in detail today,  See PACEART report PA/Lateral chest xray- reveals stable lead position  Assessment and Plan:  1. Mobitz II second degree AV block Normal pacemaker function Atrial lead threshold is elevated though lead position is good.  No indication for revision at this time.  We will follow clinically. See Pace Art report No changes today  2. Chronic diastolic dysfunction Stable  No change required today  Merlin Return to see me in 9 months Follow-up with Dr Eden EmmsNishan as scheduled

## 2014-03-21 NOTE — Telephone Encounter (Signed)
New message     [facility]  Fax number is [phone].  Please fax xray results

## 2014-03-21 NOTE — Telephone Encounter (Signed)
Xray faxed.

## 2014-06-20 ENCOUNTER — Ambulatory Visit (INDEPENDENT_AMBULATORY_CARE_PROVIDER_SITE_OTHER): Payer: Medicare (Managed Care) | Admitting: *Deleted

## 2014-06-20 DIAGNOSIS — I441 Atrioventricular block, second degree: Secondary | ICD-10-CM

## 2014-06-20 NOTE — Progress Notes (Signed)
Remote pacemaker transmission.   

## 2014-06-22 LAB — MDC_IDC_ENUM_SESS_TYPE_REMOTE
Battery Remaining Percentage: 95.5 %
Battery Voltage: 2.99 V
Brady Statistic AP VP Percent: 4.9 %
Brady Statistic AP VS Percent: 1 %
Brady Statistic AS VP Percent: 95 %
Brady Statistic RA Percent Paced: 4.8 %
Brady Statistic RV Percent Paced: 99 %
Date Time Interrogation Session: 20150623060010
Lead Channel Impedance Value: 480 Ohm
Lead Channel Sensing Intrinsic Amplitude: 5 mV
Lead Channel Sensing Intrinsic Amplitude: 6.7 mV
Lead Channel Setting Pacing Amplitude: 2.5 V
Lead Channel Setting Pacing Amplitude: 2.5 V
Lead Channel Setting Sensing Sensitivity: 3 mV
MDC IDC MSMT BATTERY REMAINING LONGEVITY: 109 mo
MDC IDC MSMT LEADCHNL RV IMPEDANCE VALUE: 750 Ohm
MDC IDC PG SERIAL: 7569773
MDC IDC SET LEADCHNL RV PACING PULSEWIDTH: 0.4 ms
MDC IDC STAT BRADY AS VS PERCENT: 1 %

## 2014-06-27 ENCOUNTER — Encounter: Payer: Self-pay | Admitting: Cardiology

## 2014-08-30 ENCOUNTER — Encounter: Payer: Self-pay | Admitting: Internal Medicine

## 2014-09-21 ENCOUNTER — Ambulatory Visit (INDEPENDENT_AMBULATORY_CARE_PROVIDER_SITE_OTHER): Payer: Medicare (Managed Care) | Admitting: *Deleted

## 2014-09-21 DIAGNOSIS — I441 Atrioventricular block, second degree: Secondary | ICD-10-CM

## 2014-09-21 NOTE — Progress Notes (Signed)
Remote pacemaker transmission.   

## 2014-09-25 LAB — MDC_IDC_ENUM_SESS_TYPE_REMOTE
Battery Remaining Percentage: 93 %
Battery Voltage: 2.98 V
Brady Statistic AP VP Percent: 5.4 %
Brady Statistic AS VP Percent: 95 %
Brady Statistic AS VS Percent: 1 %
Brady Statistic RV Percent Paced: 99 %
Date Time Interrogation Session: 20150924060011
Implantable Pulse Generator Model: 2240
Implantable Pulse Generator Serial Number: 7569773
Lead Channel Impedance Value: 740 Ohm
Lead Channel Pacing Threshold Amplitude: 0.5 V
Lead Channel Pacing Threshold Pulse Width: 0.4 ms
Lead Channel Pacing Threshold Pulse Width: 0.7 ms
Lead Channel Setting Pacing Amplitude: 2.5 V
Lead Channel Setting Pacing Pulse Width: 0.4 ms
Lead Channel Setting Sensing Sensitivity: 3 mV
MDC IDC MSMT BATTERY REMAINING LONGEVITY: 104 mo
MDC IDC MSMT LEADCHNL RA IMPEDANCE VALUE: 480 Ohm
MDC IDC MSMT LEADCHNL RA PACING THRESHOLD AMPLITUDE: 1.5 V
MDC IDC MSMT LEADCHNL RA SENSING INTR AMPL: 5 mV
MDC IDC MSMT LEADCHNL RV SENSING INTR AMPL: 8.4 mV
MDC IDC SET LEADCHNL RV PACING AMPLITUDE: 2.5 V
MDC IDC STAT BRADY AP VS PERCENT: 1 %
MDC IDC STAT BRADY RA PERCENT PACED: 5.3 %

## 2014-10-02 ENCOUNTER — Encounter: Payer: Self-pay | Admitting: Cardiology

## 2014-10-05 ENCOUNTER — Encounter: Payer: Self-pay | Admitting: Internal Medicine

## 2014-11-28 ENCOUNTER — Ambulatory Visit
Admission: RE | Admit: 2014-11-28 | Discharge: 2014-11-28 | Disposition: A | Discharge status: Home / Self Care | Payer: Medicare (Managed Care) | Source: Ambulatory Visit | Attending: *Deleted | Admitting: *Deleted

## 2014-11-28 ENCOUNTER — Other Ambulatory Visit: Payer: Self-pay | Admitting: *Deleted

## 2014-11-28 DIAGNOSIS — R52 Pain, unspecified: Secondary | ICD-10-CM

## 2014-11-29 ENCOUNTER — Other Ambulatory Visit: Payer: Self-pay | Admitting: Family Medicine

## 2014-11-29 ENCOUNTER — Ambulatory Visit
Admission: RE | Admit: 2014-11-29 | Discharge: 2014-11-29 | Disposition: A | Discharge status: Home / Self Care | Payer: Medicare (Managed Care) | Source: Ambulatory Visit | Attending: Family Medicine | Admitting: Family Medicine

## 2014-11-29 DIAGNOSIS — M898X5 Other specified disorders of bone, thigh: Secondary | ICD-10-CM

## 2014-12-07 ENCOUNTER — Encounter (HOSPITAL_COMMUNITY): Payer: Self-pay | Admitting: Internal Medicine

## 2014-12-18 ENCOUNTER — Ambulatory Visit (INDEPENDENT_AMBULATORY_CARE_PROVIDER_SITE_OTHER): Payer: Medicare (Managed Care) | Admitting: Internal Medicine

## 2014-12-18 ENCOUNTER — Encounter: Payer: Self-pay | Admitting: Internal Medicine

## 2014-12-18 VITALS — BP 100/72 | HR 75 | Ht <= 58 in | Wt 85.1 lb

## 2014-12-18 DIAGNOSIS — I441 Atrioventricular block, second degree: Secondary | ICD-10-CM

## 2014-12-18 DIAGNOSIS — I5032 Chronic diastolic (congestive) heart failure: Secondary | ICD-10-CM

## 2014-12-18 LAB — MDC_IDC_ENUM_SESS_TYPE_INCLINIC
Brady Statistic RV Percent Paced: 99.97 %
Implantable Pulse Generator Model: 2240
Implantable Pulse Generator Serial Number: 7569773
Lead Channel Impedance Value: 725 Ohm
Lead Channel Pacing Threshold Amplitude: 0.5 V
Lead Channel Pacing Threshold Pulse Width: 0.4 ms
Lead Channel Sensing Intrinsic Amplitude: 12 mV
Lead Channel Setting Pacing Amplitude: 2.5 V
Lead Channel Setting Sensing Sensitivity: 3 mV
MDC IDC MSMT BATTERY REMAINING LONGEVITY: 130.8 mo
MDC IDC MSMT BATTERY VOLTAGE: 2.98 V
MDC IDC MSMT LEADCHNL RA IMPEDANCE VALUE: 512.5 Ohm
MDC IDC MSMT LEADCHNL RA PACING THRESHOLD AMPLITUDE: 1.25 V
MDC IDC MSMT LEADCHNL RA PACING THRESHOLD PULSEWIDTH: 0.8 ms
MDC IDC MSMT LEADCHNL RA SENSING INTR AMPL: 5 mV
MDC IDC SESS DTM: 20151221114804
MDC IDC SET LEADCHNL RV PACING AMPLITUDE: 0.75 V
MDC IDC SET LEADCHNL RV PACING PULSEWIDTH: 0.4 ms
MDC IDC STAT BRADY RA PERCENT PACED: 5.7 %

## 2014-12-18 NOTE — Progress Notes (Signed)
PCP: Mary Hartman,Mary NICHOLAS, Mary Hartman Primary Cardiologist:  Mary Mary Hartman  Mary Hartman is a 80 y.o. female who presents today for routine electrophysiology followup.  She is elderly and frail.  She has developed progressive and diffuse orthopedic issues.  Her primary concern today is with L thigh/ hip pain.  She has difficulty ambulating up stairs and will develop SOB with this exertion.  Today, she denies symptoms of palpitations, chest pain, lower extremity edema, dizziness, presyncope, or syncope.  The patient is otherwise without complaint today.   Past Medical History  Diagnosis Date  . Osteoporosis   . Hypercholesteremia   . Orthopedic aftercare for healing traumatic leg fracture   . Rib fracture   . Osteoarthritis   . Spinal stenosis   . DEMENTIA   . CHF (congestive heart failure)   . Mobitz II 11-2013    s/p pacemaker   Past Surgical History  Procedure Laterality Date  . Hernia repair    . Orthopedic surgery    . Pacemaker insertion  12-09-2013    STJ Assurity dual chamber pacemaker placed by Mary Hartman  . Permanent pacemaker insertion N/A 12/09/2013    Procedure: PERMANENT PACEMAKER INSERTION;  Surgeon: Mary RhymeJames D Brenan [place], Mary Hartman;  Location: [facility]ATH [facility];  Service: Cardiovascular;  Laterality: N/A;    Current Outpatient Prescriptions  Medication Sig Dispense Refill  . acetaminophen (TYLENOL) 325 MG tablet Take 650 mg by mouth daily.     Marland Kitchen. aspirin 81 MG chewable tablet Chew 81 mg by mouth daily.    . Calcium Carb-Cholecalciferol 600-400 MG-UNIT TABS Take 1 tablet by mouth daily.    . citalopram (CELEXA) 10 MG tablet Take 10 mg by mouth at bedtime.    . donepezil (ARICEPT) 10 MG tablet Take 10 mg by mouth at bedtime.    . furosemide (LASIX) 20 MG tablet Take 1 tablet (20 mg total) by mouth daily. 30 tablet 2  . gabapentin (NEURONTIN) 100 MG capsule Take 100 mg by mouth 2 (two) times daily.    Marland Kitchen. HYDROcodone-acetaminophen (NORCO/VICODIN) 5-325 MG per tablet Take 1 tablet by mouth 3 (three)  times daily.    Marland Kitchen. lidocaine (LIDODERM) 5 % Place 1 patch onto the skin daily. AS NEEDED FOR PAIN. Remove & Discard patch within 12 hours or as directed by Mary Hartman    . menthol-thymol (ABSORBINE JR) LIQD Apply 1 application topically every 8 (eight) hours as needed (for pain).    . Multiple Vitamin (MULTIVITAMIN WITH MINERALS) TABS Take 1 tablet by mouth daily.    . Nutritional Supplements (BOOST BREEZE) LIQD Take 1 Can by mouth daily.     No current facility-administered medications for this visit.   ROS- all systems are reviewed and negative except as per HPI above  Physical Exam: Filed Vitals:   12/18/14 1041  BP: 100/72  Pulse: 75  Height: 4\' 5"  (1.346 m)  Weight: 85 lb 1.9 oz (38.61 kg)    GEN- The patient is elderly and frail appearing, alert and oriented x 3 today.   Head- normocephalic, atraumatic Eyes-  Sclera clear, conjunctiva pink Ears- hearing intact Oropharynx- clear Lungs- Clear to ausculation bilaterally, normal work of breathing Chest- pacemaker pocket is well healed Heart- Regular rate and rhythm, no murmurs, rubs or gallops, PMI not laterally displaced GI- soft, NT, ND, + BS Extremities- no clubbing, cyanosis, or edema  Pacemaker interrogation- reviewed in detail today,  See PACEART report  Assessment and Plan:  1. Mobitz II second degree AV block Normal pacemaker function Atrial  lead threshold is elevated though lead position is good.  No indication for revision at this time.  We will follow clinically. See Pace Art report No changes today  2. Chronic diastolic dysfunction Stable No change required today  Merlin Return to see Mary Hartman in 6 months I will see again in 1 year

## 2014-12-18 NOTE — Patient Instructions (Signed)
Your physician wants you to follow-up in: 6 months with Lori Gerhardt,NP and 12 months with Dr Allred You will receive a reminder letter in the mail two months in advance. If you don't receive a letter, please call our office to schedule the follow-up appointment.  Remote monitoring is used to monitor your Pacemaker or ICD from home. This monitoring reduces the number of office visits required to check your device to one time per year. It allows us to keep an eye on the functioning of your device to ensure it is working properly. You are scheduled for a device check from home on 03/19/15. You may send your transmission at any time that day. If you have a wireless device, the transmission will be sent automatically. After your physician reviews your transmission, you will receive a postcard with your next transmission date.    

## 2015-02-05 IMAGING — CR DG FEMUR 2V*L*
4 series · 4 of 4 positions shown · non-contrast
Comparison: None.

CLINICAL DATA: LEFT femoral pain for 1 week. No trauma. Initial
encounter.

EXAM:
LEFT FEMUR - 2 VIEW

[t femur with hip  ap left]
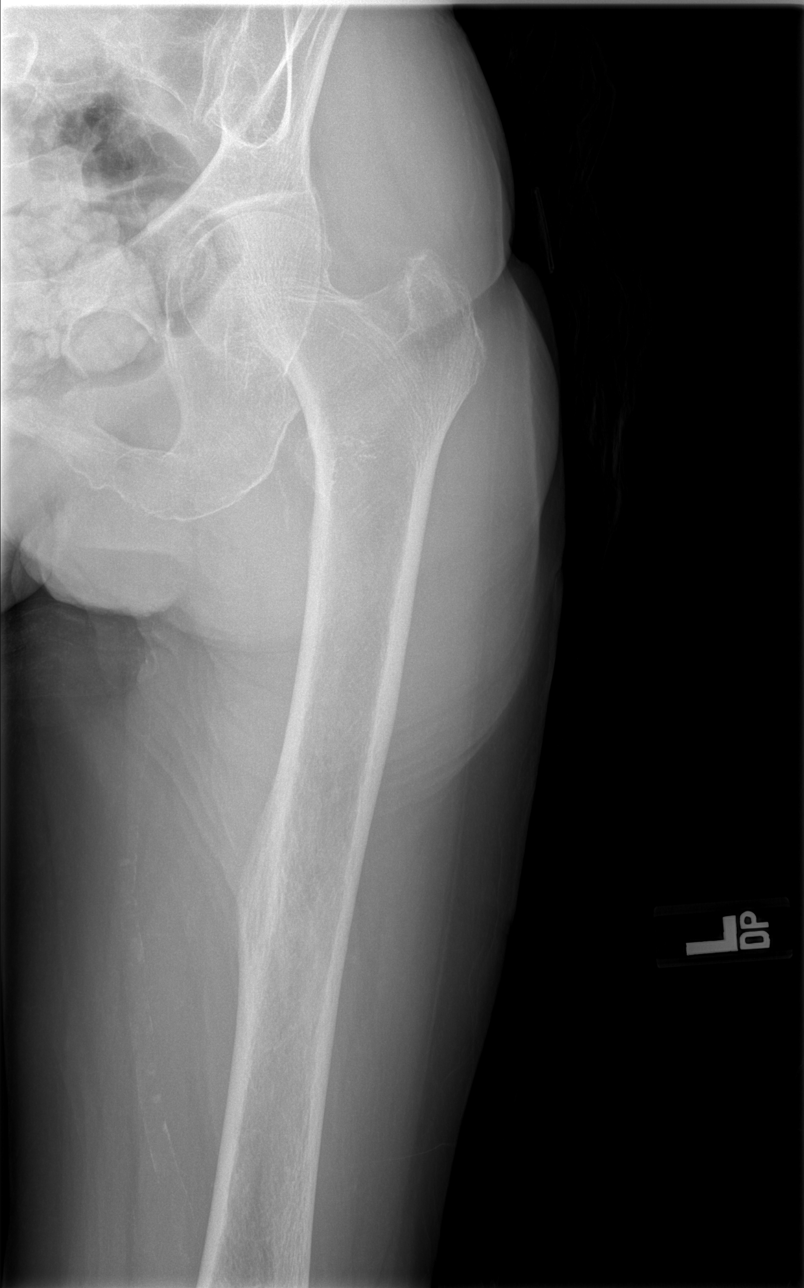

[t femur with knee ap left]
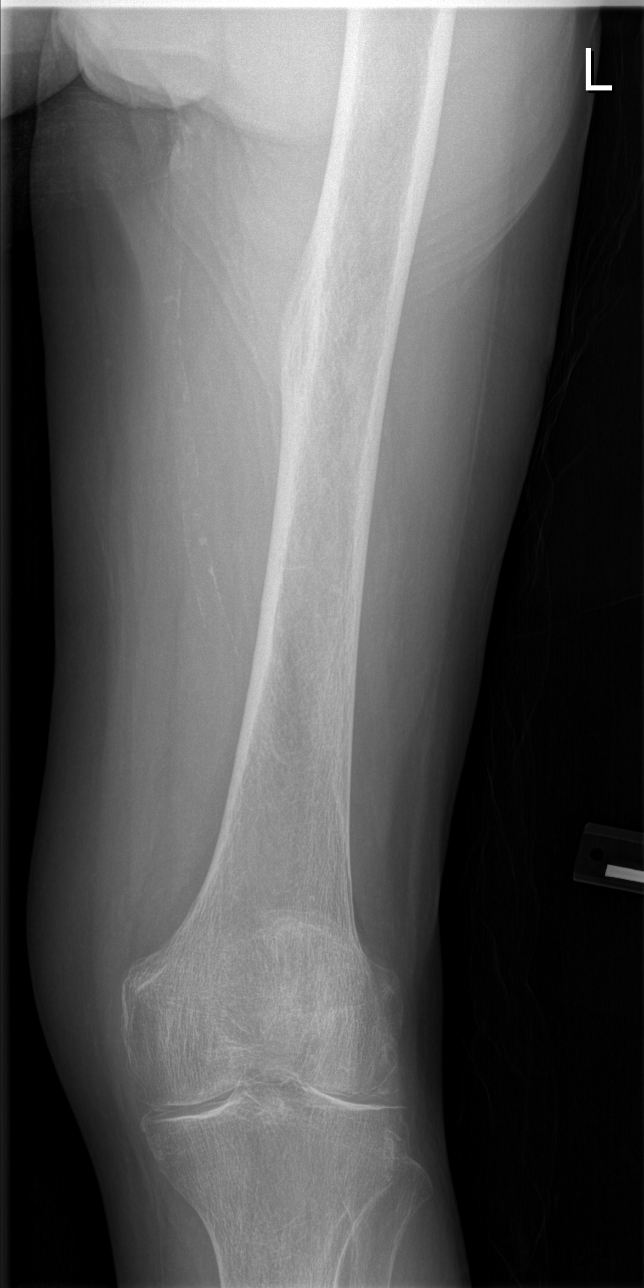

[t femur with hip lat left]
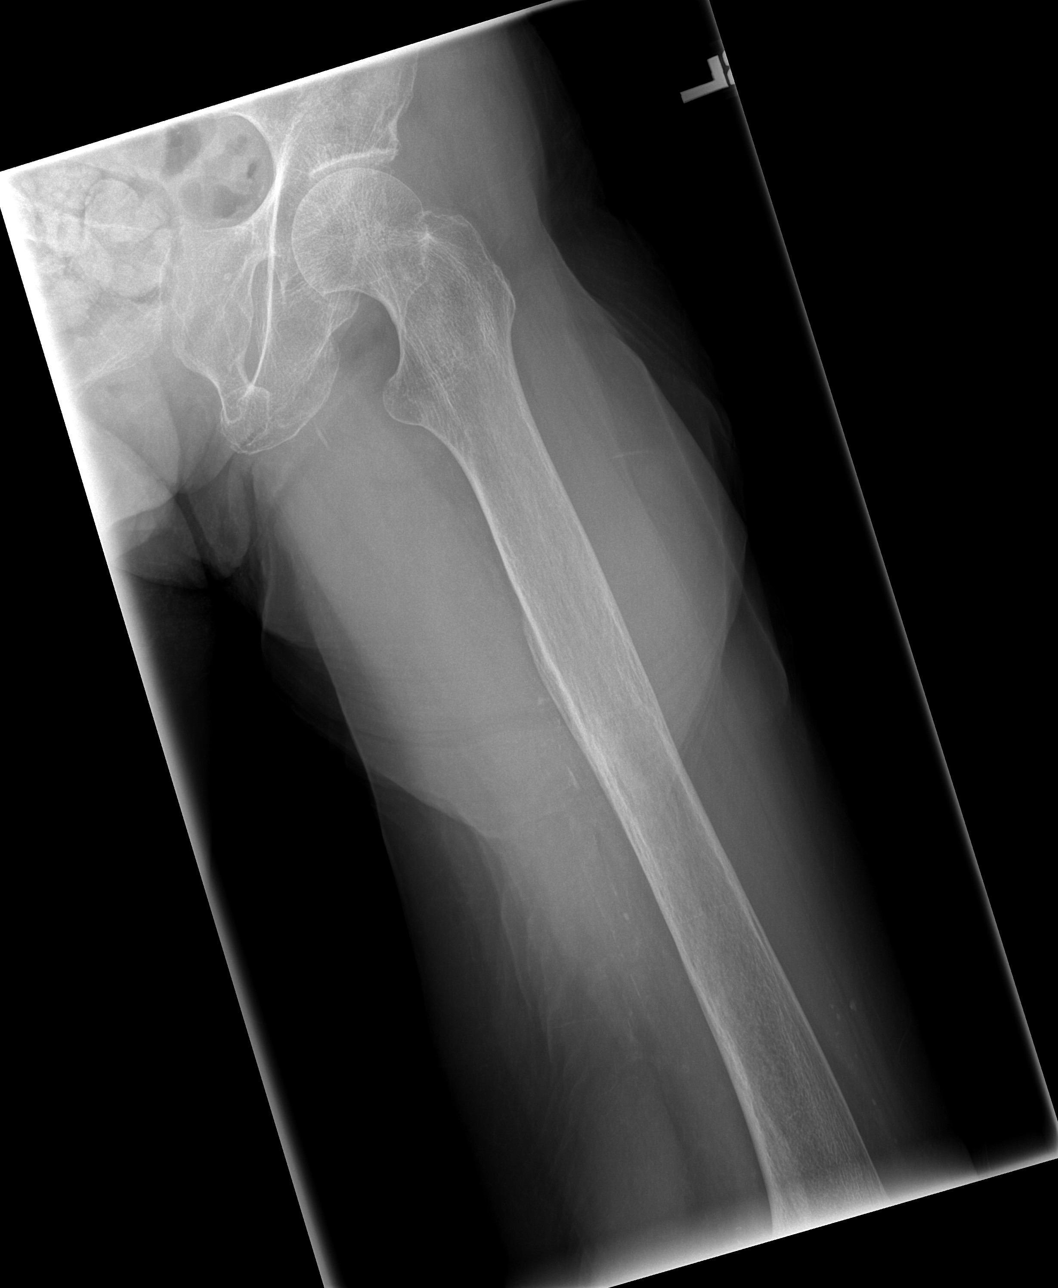

[t femur with knee lat left]
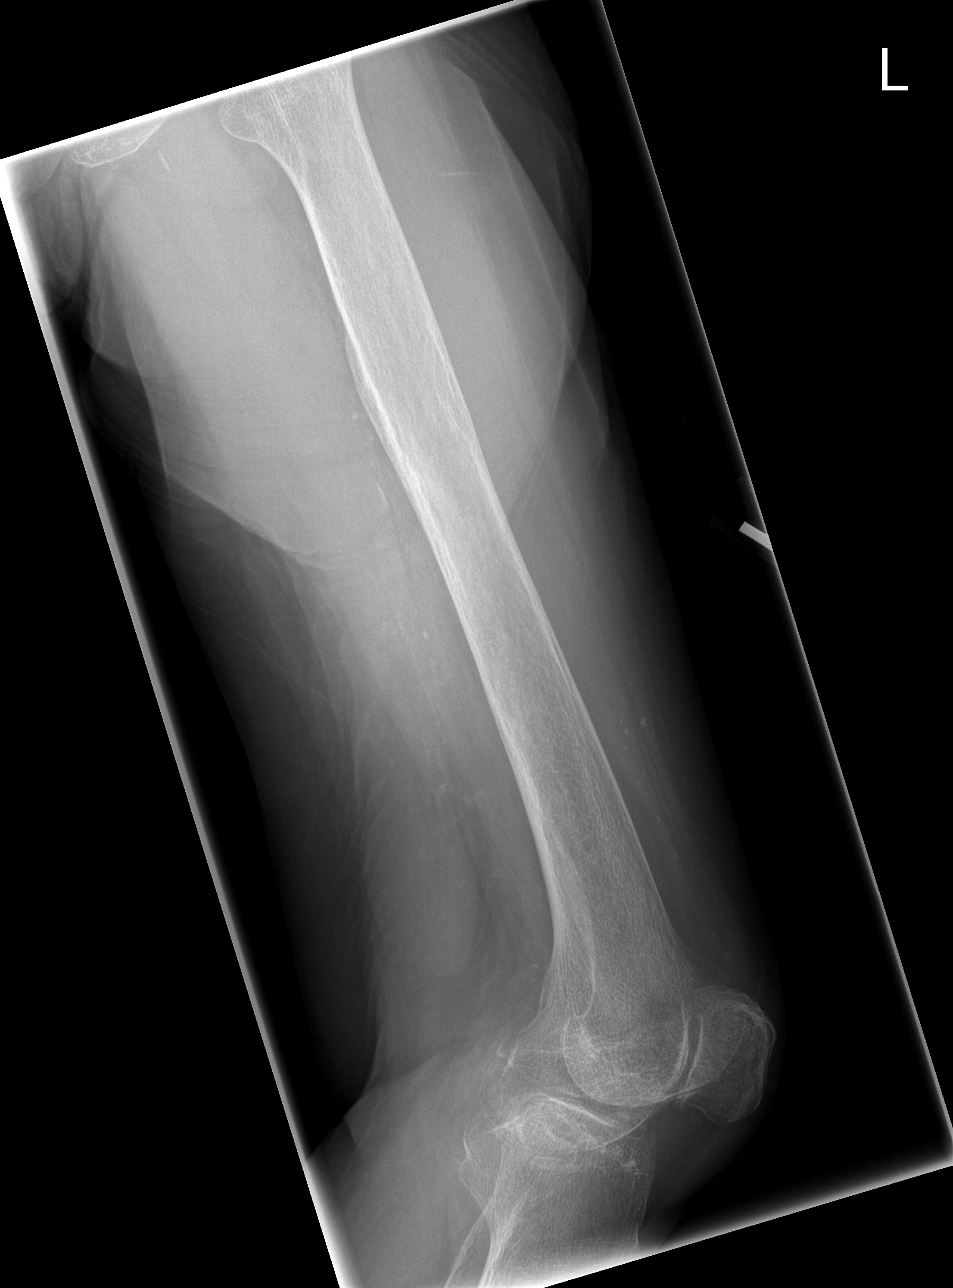

[4 of 4 positions shown; findings below may reference images not displayed]

FINDINGS: There is chronic subperiosteal new bone in the medial mid LEFT
femoral diaphysis. This may represent a chronic stress fracture
however a an indolent neoplasm cannot be excluded. This is difficult
to visualize on the lateral view. This could represent an old
fracture however there is no given history of injury.
Atherosclerosis is present. Osteopenia.
IMPRESSION: Medial LEFT femoral diaphysis midshaft lamellated likely chronic
periosteal reaction. In this patient with osteopenia, this most
likely represents a chronic stress fracture. Indolent neoplasm
cannot be excluded. Although MRI is the preferred modality for
follow-up, the patient appears to have pacemaker precluding MRI. CT
of the femur without contrast would be the next best step in
assessment. All whole-body bone scan should also be considered to
assess for other foci. This can be performed after the CT of the
femur.

## 2015-02-12 ENCOUNTER — Encounter (HOSPITAL_COMMUNITY): Payer: Self-pay | Admitting: Emergency Medicine

## 2015-02-12 ENCOUNTER — Emergency Department (HOSPITAL_COMMUNITY): Payer: Medicare (Managed Care)

## 2015-02-12 DIAGNOSIS — I5032 Chronic diastolic (congestive) heart failure: Secondary | ICD-10-CM | POA: Diagnosis present

## 2015-02-12 DIAGNOSIS — R9401 Abnormal electroencephalogram [EEG]: Secondary | ICD-10-CM | POA: Diagnosis not present

## 2015-02-12 DIAGNOSIS — J8410 Pulmonary fibrosis, unspecified: Secondary | ICD-10-CM

## 2015-02-12 DIAGNOSIS — M1990 Unspecified osteoarthritis, unspecified site: Secondary | ICD-10-CM | POA: Diagnosis present

## 2015-02-12 DIAGNOSIS — Z01818 Encounter for other preprocedural examination: Secondary | ICD-10-CM

## 2015-02-12 DIAGNOSIS — R1310 Dysphagia, unspecified: Secondary | ICD-10-CM

## 2015-02-12 DIAGNOSIS — Z6827 Body mass index (BMI) 27.0-27.9, adult: Secondary | ICD-10-CM

## 2015-02-12 DIAGNOSIS — N183 Chronic kidney disease, stage 3 unspecified: Secondary | ICD-10-CM | POA: Diagnosis present

## 2015-02-12 DIAGNOSIS — R269 Unspecified abnormalities of gait and mobility: Secondary | ICD-10-CM

## 2015-02-12 DIAGNOSIS — E43 Unspecified severe protein-calorie malnutrition: Secondary | ICD-10-CM | POA: Diagnosis present

## 2015-02-12 DIAGNOSIS — J849 Interstitial pulmonary disease, unspecified: Secondary | ICD-10-CM | POA: Diagnosis present

## 2015-02-12 DIAGNOSIS — G253 Myoclonus: Secondary | ICD-10-CM | POA: Diagnosis not present

## 2015-02-12 DIAGNOSIS — G309 Alzheimer's disease, unspecified: Secondary | ICD-10-CM | POA: Diagnosis present

## 2015-02-12 DIAGNOSIS — R05 Cough: Secondary | ICD-10-CM | POA: Diagnosis not present

## 2015-02-12 DIAGNOSIS — M4800 Spinal stenosis, site unspecified: Secondary | ICD-10-CM | POA: Diagnosis present

## 2015-02-12 DIAGNOSIS — Z7982 Long term (current) use of aspirin: Secondary | ICD-10-CM

## 2015-02-12 DIAGNOSIS — Z0189 Encounter for other specified special examinations: Secondary | ICD-10-CM

## 2015-02-12 DIAGNOSIS — R740 Nonspecific elevation of levels of transaminase and lactic acid dehydrogenase [LDH]: Secondary | ICD-10-CM

## 2015-02-12 DIAGNOSIS — I469 Cardiac arrest, cause unspecified: Secondary | ICD-10-CM | POA: Diagnosis present

## 2015-02-12 DIAGNOSIS — R55 Syncope and collapse: Secondary | ICD-10-CM | POA: Diagnosis present

## 2015-02-12 DIAGNOSIS — I471 Supraventricular tachycardia: Secondary | ICD-10-CM | POA: Diagnosis present

## 2015-02-12 DIAGNOSIS — E780 Pure hypercholesterolemia: Secondary | ICD-10-CM | POA: Diagnosis present

## 2015-02-12 DIAGNOSIS — Z66 Do not resuscitate: Secondary | ICD-10-CM | POA: Diagnosis not present

## 2015-02-12 DIAGNOSIS — J189 Pneumonia, unspecified organism: Secondary | ICD-10-CM | POA: Insufficient documentation

## 2015-02-12 DIAGNOSIS — R042 Hemoptysis: Secondary | ICD-10-CM | POA: Diagnosis present

## 2015-02-12 DIAGNOSIS — Z978 Presence of other specified devices: Secondary | ICD-10-CM

## 2015-02-12 DIAGNOSIS — Z950 Presence of cardiac pacemaker: Secondary | ICD-10-CM

## 2015-02-12 DIAGNOSIS — G9340 Encephalopathy, unspecified: Secondary | ICD-10-CM | POA: Diagnosis not present

## 2015-02-12 DIAGNOSIS — G8929 Other chronic pain: Secondary | ICD-10-CM | POA: Diagnosis present

## 2015-02-12 DIAGNOSIS — F0280 Dementia in other diseases classified elsewhere without behavioral disturbance: Secondary | ICD-10-CM | POA: Diagnosis present

## 2015-02-12 DIAGNOSIS — R64 Cachexia: Secondary | ICD-10-CM | POA: Diagnosis present

## 2015-02-12 DIAGNOSIS — R0602 Shortness of breath: Secondary | ICD-10-CM | POA: Diagnosis not present

## 2015-02-12 DIAGNOSIS — M6281 Muscle weakness (generalized): Secondary | ICD-10-CM | POA: Diagnosis present

## 2015-02-12 DIAGNOSIS — R059 Cough, unspecified: Secondary | ICD-10-CM | POA: Diagnosis present

## 2015-02-12 DIAGNOSIS — M810 Age-related osteoporosis without current pathological fracture: Secondary | ICD-10-CM | POA: Diagnosis present

## 2015-02-12 DIAGNOSIS — D638 Anemia in other chronic diseases classified elsewhere: Secondary | ICD-10-CM | POA: Diagnosis present

## 2015-02-12 DIAGNOSIS — R262 Difficulty in walking, not elsewhere classified: Secondary | ICD-10-CM | POA: Diagnosis present

## 2015-02-12 DIAGNOSIS — I214 Non-ST elevation (NSTEMI) myocardial infarction: Secondary | ICD-10-CM | POA: Diagnosis not present

## 2015-02-12 DIAGNOSIS — I441 Atrioventricular block, second degree: Secondary | ICD-10-CM

## 2015-02-12 DIAGNOSIS — J9601 Acute respiratory failure with hypoxia: Secondary | ICD-10-CM | POA: Diagnosis not present

## 2015-02-12 DIAGNOSIS — F0390 Unspecified dementia without behavioral disturbance: Secondary | ICD-10-CM

## 2015-02-12 DIAGNOSIS — M5430 Sciatica, unspecified side: Secondary | ICD-10-CM | POA: Diagnosis present

## 2015-02-12 LAB — CBC WITH DIFFERENTIAL/PLATELET
BASOS PCT: 0 % (ref 0–1)
Basophils Absolute: 0 10*3/uL (ref 0.0–0.1)
EOS ABS: 0.2 10*3/uL (ref 0.0–0.7)
Eosinophils Relative: 3 % (ref 0–5)
HCT: 43 % (ref 36.0–46.0)
HEMOGLOBIN: 14.5 g/dL (ref 12.0–15.0)
LYMPHS PCT: 58 % — AB (ref 12–46)
Lymphs Abs: 3.7 10*3/uL (ref 0.7–4.0)
MCH: 27.7 pg (ref 26.0–34.0)
MCHC: 33.7 g/dL (ref 30.0–36.0)
MCV: 82.2 fL (ref 78.0–100.0)
Monocytes Absolute: 0.5 10*3/uL (ref 0.1–1.0)
Monocytes Relative: 8 % (ref 3–12)
NEUTROS PCT: 31 % — AB (ref 43–77)
Neutro Abs: 2 10*3/uL (ref 1.7–7.7)
PLATELETS: 158 10*3/uL (ref 150–400)
RBC: 5.23 MIL/uL — AB (ref 3.87–5.11)
RDW: 14 % (ref 11.5–15.5)
WBC: 6.3 10*3/uL (ref 4.0–10.5)

## 2015-02-12 LAB — I-STAT VENOUS BLOOD GAS, ED
Acid-base deficit: 1 mmol/L (ref 0.0–2.0)
BICARBONATE: 22.9 meq/L (ref 20.0–24.0)
O2 Saturation: 21 %
PH VEN: 7.422 — AB (ref 7.250–7.300)
TCO2: 24 mmol/L (ref 0–100)
pCO2, Ven: 35.2 mmHg — ABNORMAL LOW (ref 45.0–50.0)
pO2, Ven: 15 mmHg — CL (ref 30.0–45.0)

## 2015-02-12 LAB — URINALYSIS, ROUTINE W REFLEX MICROSCOPIC
Bilirubin Urine: NEGATIVE
GLUCOSE, UA: NEGATIVE mg/dL
HGB URINE DIPSTICK: NEGATIVE
Ketones, ur: NEGATIVE mg/dL
Leukocytes, UA: NEGATIVE
Nitrite: NEGATIVE
PH: 5.5 (ref 5.0–8.0)
PROTEIN: NEGATIVE mg/dL
SPECIFIC GRAVITY, URINE: 1.022 (ref 1.005–1.030)
UROBILINOGEN UA: 0.2 mg/dL (ref 0.0–1.0)

## 2015-02-12 LAB — COMPREHENSIVE METABOLIC PANEL
ALK PHOS: 79 U/L (ref 39–117)
ALT: 37 U/L — ABNORMAL HIGH (ref 0–35)
ANION GAP: 12 (ref 5–15)
AST: 39 U/L — ABNORMAL HIGH (ref 0–37)
Albumin: 3 g/dL — ABNORMAL LOW (ref 3.5–5.2)
BUN: 18 mg/dL (ref 6–23)
CHLORIDE: 111 mmol/L (ref 96–112)
CO2: 20 mmol/L (ref 19–32)
CREATININE: 1.22 mg/dL — AB (ref 0.50–1.10)
Calcium: 8.7 mg/dL (ref 8.4–10.5)
GFR, EST AFRICAN AMERICAN: 46 mL/min — AB (ref >90)
GFR, EST NON AFRICAN AMERICAN: 40 mL/min — AB (ref >90)
Glucose, Bld: 167 mg/dL — ABNORMAL HIGH (ref 70–99)
POTASSIUM: 3.9 mmol/L (ref 3.5–5.1)
Sodium: 143 mmol/L (ref 135–145)
Total Bilirubin: 0.7 mg/dL (ref 0.3–1.2)
Total Protein: 6.6 g/dL (ref 6.0–8.3)

## 2015-02-12 LAB — POC OCCULT BLOOD, ED: Fecal Occult Bld: NEGATIVE

## 2015-02-12 LAB — D-DIMER, QUANTITATIVE (NOT AT ARMC): D DIMER QUANT: 1.47 ug{FEU}/mL — AB (ref 0.00–0.48)

## 2015-02-12 LAB — TROPONIN I: Troponin I: 0.12 ng/mL — ABNORMAL HIGH (ref <0.031)

## 2015-02-12 MED ORDER — DONEPEZIL HCL 10 MG PO TABS
10.0000 mg | ORAL_TABLET | Freq: Every day | ORAL | Status: DC
  Administered 2015-02-12 – 2015-02-13 (×2): 10 mg via ORAL
  Filled 2015-02-12 (×3): qty 1

## 2015-02-12 MED ORDER — CITALOPRAM HYDROBROMIDE 10 MG PO TABS
10.0000 mg | ORAL_TABLET | Freq: Every day | ORAL | Status: DC
  Administered 2015-02-12 – 2015-02-13 (×2): 10 mg via ORAL
  Filled 2015-02-12 (×3): qty 1

## 2015-02-12 MED ORDER — HEPARIN SODIUM (PORCINE) 5000 UNIT/ML IJ SOLN
5000.0000 [IU] | Freq: Three times a day (TID) | INTRAMUSCULAR | Status: DC
  Administered 2015-02-12 – 2015-02-16 (×11): 5000 [IU] via SUBCUTANEOUS
  Filled 2015-02-12 (×15): qty 1

## 2015-02-12 MED ORDER — IOHEXOL 350 MG/ML SOLN
70.0000 mL | Freq: Once | INTRAVENOUS | Status: AC | PRN
  Administered 2015-02-12: 70 mL via INTRAVENOUS

## 2015-02-12 MED ORDER — SODIUM CHLORIDE 0.9 % IV SOLN
INTRAVENOUS | Status: AC
  Administered 2015-02-12: 23:00:00 via INTRAVENOUS

## 2015-02-12 MED ORDER — ONDANSETRON HCL 4 MG PO TABS: 4.0000 mg | ORAL_TABLET | Freq: Four times a day (QID) | ORAL | Status: DC | PRN

## 2015-02-12 MED ORDER — LIDOCAINE 5 % EX PTCH
1.0000 | MEDICATED_PATCH | Freq: Every day | CUTANEOUS | Status: DC | PRN
  Filled 2015-02-12: qty 1

## 2015-02-12 MED ORDER — ASPIRIN 325 MG PO TABS
325.0000 mg | ORAL_TABLET | Freq: Once | ORAL | Status: AC
  Administered 2015-02-13: 325 mg via ORAL
  Filled 2015-02-12: qty 1

## 2015-02-12 MED ORDER — GABAPENTIN 100 MG PO CAPS
100.0000 mg | ORAL_CAPSULE | Freq: Two times a day (BID) | ORAL | Status: DC
  Administered 2015-02-12 – 2015-02-14 (×3): 100 mg via ORAL
  Filled 2015-02-12 (×5): qty 1

## 2015-02-12 MED ORDER — MUSCLE RUB 10-15 % EX CREA
TOPICAL_CREAM | Freq: Three times a day (TID) | CUTANEOUS | Status: DC | PRN
  Filled 2015-02-12: qty 85

## 2015-02-12 MED ORDER — ONDANSETRON HCL 4 MG/2ML IJ SOLN: 4.0000 mg | Freq: Four times a day (QID) | INTRAMUSCULAR | Status: DC | PRN

## 2015-02-12 MED ORDER — ABSORBINE JR EX LIQD: 1.0000 "application " | Freq: Three times a day (TID) | CUTANEOUS | Status: DC | PRN

## 2015-02-12 MED ORDER — DEXTROSE 5 % IV SOLN
500.0000 mg | Freq: Once | INTRAVENOUS | Status: AC
  Administered 2015-02-12: 500 mg via INTRAVENOUS
  Filled 2015-02-12: qty 500

## 2015-02-12 MED ORDER — DEXTROSE 5 % IV SOLN
1.0000 g | Freq: Once | INTRAVENOUS | Status: AC
  Administered 2015-02-12: 1 g via INTRAVENOUS
  Filled 2015-02-12: qty 10

## 2015-02-12 MED ORDER — ACETAMINOPHEN 325 MG PO TABS
650.0000 mg | ORAL_TABLET | Freq: Every day | ORAL | Status: DC
  Administered 2015-02-12 – 2015-02-13 (×2): 650 mg via ORAL
  Filled 2015-02-12 (×3): qty 2

## 2015-02-12 MED ORDER — ASPIRIN 81 MG PO CHEW
81.0000 mg | CHEWABLE_TABLET | Freq: Every day | ORAL | Status: DC
  Administered 2015-02-14: 81 mg via ORAL
  Filled 2015-02-12 (×3): qty 1

## 2015-02-12 NOTE — ED Notes (Signed)
Pt daughter at bedside and states pt did lose consciousness, sats were 85% when hooked up to monitor, placed pt on 2 L O2.

## 2015-02-12 NOTE — H&P (Signed)
Date: 02/19/2015               Patient Name:  Mary Hartman MRN: [id number]  DOB: [birth year] Age / Sex: 81 y.o., female   PCP: Janifer Adie, MD         Medical Service: Internal Medicine Teaching Service         Attending Physician: Dr. Madilyn Fireman, MD    First Contact: Dr. Sherrine Maples Pager: [phone]  Second Contact: Dr. Redmond Pulling Pager: [phone]       After Hours (After 5p/  First Contact Pager: [phone]  weekends / holidays): Second Contact Pager: [phone]   Chief Complaint: syncope, cough, weakness,   History of Present Illness:  History is obtained through patient, and ED provider. Daughter was gone when we were interviewing the patient.  81 yo female with Osteoporesis, rib fx, leg fx, OA, dementia, morbitz II s/p pacemaker, CHF here with coughing for last 2 weeks, with clear sputum with some blood mixed with sputum per ED record. Had a witnessed syncope lasting 1 minute per daughter. Takes pain med for chronic back pain (took 2 vicodin tabs this morning). This has been recently increased due to increased pain related to her OA on both hips, legs. She has been having some balance problems last few days, usually walks to the bathroom on her own but hasn't been able to do that today. Then she had the syncopal event. No n/v but had decreased appetite for 1 day.   During our interview, patient said she is hungry. Her other complaint was b/l hip pain going down her legs, has hx of spinal stenosis and takes pain meds. States using walker at home. No fever/chills, diarrhea. Denies any cp or sob.  Per ED note Hypoxia to 85% on romo air, PO2 on venous gas 15 only. Ed provider concerned about CAP.   [employer] judes pacemaker was interrogated at ED. No events today, had 6 second episode of atrial tachycardia at 160 each per min on 01/09/2015.   Meds: Current Facility-Administered Medications  Medication Dose Route Frequency Provider Last Rate Last Dose  . 0.9 %  sodium chloride infusion    Intravenous Continuous Blain Pais, MD 75 mL/hr at 02/01/2015 2320    . acetaminophen (TYLENOL) tablet 650 mg  650 mg Oral QHS Blain Pais, MD   650 mg at 02/14/2015 2309  . [START ON 02/13/2015] aspirin chewable tablet 81 mg  81 mg Oral Daily Ayshia Gramlich, MD      . aspirin tablet 325 mg  325 mg Oral Once Raykwon Hobbs, MD      . citalopram (CELEXA) tablet 10 mg  10 mg Oral QHS Blain Pais, MD   10 mg at 01/30/2015 2309  . donepezil (ARICEPT) tablet 10 mg  10 mg Oral QHS Blain Pais, MD   10 mg at 02/04/2015 2309  . gabapentin (NEURONTIN) capsule 100 mg  100 mg Oral BID Blain Pais, MD   100 mg at 01/31/2015 2309  . heparin injection 5,000 Units  5,000 Units Subcutaneous 3 times per day Blain Pais, MD   5,000 Units at 02/25/2015 2310  . lidocaine (LIDODERM) 5 % 1 patch  1 patch Transdermal Daily PRN Blain Pais, MD      . MUSCLE RUB CREA   Topical Q8H PRN Blain Pais, MD      . ondansetron [facility]) tablet 4 mg  4 mg Oral Q6H PRN Blain Pais, MD  Or  . ondansetron (ZOFRAN) injection 4 mg  4 mg Intravenous Q6H PRN Blain Pais, MD        Allergies: Allergies as of 02/15/2015 - Review Complete 02/14/2015  Allergen Reaction Noted  . Lyrica [pregabalin] Other (See Comments) and Hypertension    Past Medical History  Diagnosis Date  . Osteoporosis   . Hypercholesteremia   . Orthopedic aftercare for healing traumatic leg fracture   . Rib fracture   . Osteoarthritis   . Spinal stenosis   . DEMENTIA   . CHF (congestive heart failure)   . Mobitz II 11-2013    s/p pacemaker  . CAP (community acquired pneumonia) 02/19/2015    Archie Endo 01/29/2015   Past Surgical History  Procedure Laterality Date  . Hernia repair    . Orthopedic surgery    . Pacemaker insertion  12-09-2013    STJ Assurity dual chamber pacemaker placed by Dr Rayann Heman  . Permanent pacemaker insertion N/A 12/09/2013    Procedure: PERMANENT PACEMAKER INSERTION;   Surgeon: Coralyn Mark, MD;  Location: [facility];  Service: Cardiovascular;  Laterality: N/A;   History reviewed. No pertinent family history. History   Social History  . Marital Status: Widowed    Spouse Name: N/A  . Number of Children: N/A  . Years of Education: N/A   Occupational History  . Not on file.   Social History Main Topics  . Smoking status: Never Smoker   . Smokeless tobacco: Never Used  . Alcohol Use: No  . Drug Use: No  . Sexual Activity: Not on file   Other Topics Concern  . Not on file   Social History Narrative    Review of Systems: Review of Systems  Constitutional: Negative for fever, chills and diaphoresis.  HENT: Negative for sore throat.   Eyes: Negative.   Respiratory: Positive for cough, sputum production and shortness of breath. Negative for hemoptysis and wheezing.   Cardiovascular: Negative for chest pain, palpitations, orthopnea, claudication and leg swelling.  Gastrointestinal: Negative for heartburn, nausea, vomiting, abdominal pain, diarrhea, constipation and blood in stool.  Genitourinary: Negative for dysuria, urgency and hematuria.  Musculoskeletal: Negative.   Skin: Negative for itching and rash.  Neurological: Positive for loss of consciousness. Negative for dizziness, tingling, tremors, sensory change, speech change, focal weakness, seizures, weakness and headaches.  Endo/Heme/Allergies: Negative.   Psychiatric/Behavioral: Negative for depression, hallucinations and memory loss. The patient is not nervous/anxious.     Physical Exam: Blood pressure 104/63, pulse 79, temperature 98.1 F (36.7 C), temperature source Oral, resp. rate 15, height 4' (1.219 m), weight 40.6 kg (89 lb 8.1 oz), SpO2 100 %. Physical Exam  Constitutional: No distress.  Very frail appearing female. Only alert to herself.   HENT:  Head: Normocephalic and atraumatic.  Right Ear: External ear normal.  Left Ear: External ear normal.  Oral mucosa dry.    Eyes: Conjunctivae and EOM are normal. Pupils are equal, round, and reactive to light. Right eye exhibits no discharge. Left eye exhibits no discharge. No scleral icterus.  Neck: Normal range of motion. Neck supple. No JVD present. No tracheal deviation present.  Cardiovascular: Normal rate, regular rhythm and normal heart sounds.  Exam reveals no gallop and no friction rub.   No murmur heard. Respiratory: Effort normal and breath sounds normal. No stridor. No respiratory distress. She has no wheezes. She has no rales. She exhibits no tenderness.  Has velcro crackles on both bases. No wheezing.   GI: Soft.  Bowel sounds are normal. She exhibits no distension.  Musculoskeletal: Normal range of motion. She exhibits tenderness. She exhibits no edema.  Has hip pain going down her legs.  Has MCP contracture on both hands, left more pronounced than right hand. Only the index finger was able to be extended fully on the left hand.   Lymphadenopathy:    She has no cervical adenopathy.  Neurological: She is alert. No cranial nerve deficit. Coordination normal.  Oriented to self only.   Skin: She is not diaphoretic.     Lab results: Basic Metabolic Panel:  Recent Labs  02/19/2015 1641  NA 143  K 3.9  CL 111  CO2 20  GLUCOSE 167*  BUN 18  CREATININE 1.22*  CALCIUM 8.7   Liver Function Tests:  Recent Labs  02/06/2015 1641  AST 39*  ALT 37*  ALKPHOS 79  BILITOT 0.7  PROT 6.6  ALBUMIN 3.0*   No results for input(s): LIPASE, AMYLASE in the last 72 hours. No results for input(s): AMMONIA in the last 72 hours. CBC:  Recent Labs  02/22/2015 1641  WBC 6.3  NEUTROABS 2.0  HGB 14.5  HCT 43.0  MCV 82.2  PLT 158   Cardiac Enzymes:  Recent Labs  01/30/2015 2158  TROPONINI 0.12*   BNP: No results for input(s): PROBNP in the last 72 hours. D-Dimer:  Recent Labs  02/24/2015 1641  DDIMER 1.47*   CBG: No results for input(s): GLUCAP in the last 72 hours. Hemoglobin A1C: No  results for input(s): HGBA1C in the last 72 hours. Fasting Lipid Panel: No results for input(s): CHOL, HDL, LDLCALC, TRIG, CHOLHDL, LDLDIRECT in the last 72 hours. Thyroid Function Tests: No results for input(s): TSH, T4TOTAL, FREET4, T3FREE, THYROIDAB in the last 72 hours. Anemia Panel: No results for input(s): VITAMINB12, FOLATE, FERRITIN, TIBC, IRON, RETICCTPCT in the last 72 hours. Coagulation: No results for input(s): LABPROT, INR in the last 72 hours. Urine Drug Screen: Drugs of Abuse  No results found for: LABOPIA, COCAINSCRNUR, LABBENZ, AMPHETMU, THCU, LABBARB  Alcohol Level: No results for input(s): ETH in the last 72 hours. Urinalysis:  Recent Labs  02/25/2015 1719  COLORURINE YELLOW  LABSPEC 1.022  PHURINE 5.5  GLUCOSEU NEGATIVE  HGBUR NEGATIVE  BILIRUBINUR NEGATIVE  KETONESUR NEGATIVE  PROTEINUR NEGATIVE  UROBILINOGEN 0.2  NITRITE NEGATIVE  LEUKOCYTESUR NEGATIVE   Misc. Labs:  Imaging results:  Dg Chest 2 View  02/13/2015   CLINICAL DATA:  Cough and wheezing for 2 days  EXAM: CHEST  2 VIEW  COMPARISON:  03/20/2014  FINDINGS: Cardiac shadow is mildly prominent. A pacing device is again seen and stable. Diffuse chronic interstitial changes are noted throughout both lungs. Some slight increased density in the right upper lobe may represent progression of the chronic changes or possibly acute on chronic infiltrate. No sizable effusion is seen.  IMPRESSION: Diffuse chronic interstitial changes. There is some suggestion of acute on chronic infiltrate in the right upper lobe.   Electronically Signed   By: Inez Catalina M.D.   On: 02/21/2015 17:01   Ct Angio Chest Pe W/cm &/or Wo Cm  02/21/2015   CLINICAL DATA:  Elevated D-dimer.  Near syncope.  EXAM: CT ANGIOGRAPHY CHEST WITH CONTRAST  TECHNIQUE: Multidetector CT imaging of the chest was performed using the standard protocol during bolus administration of intravenous contrast. Multiplanar CT image reconstructions and MIPs were  obtained to evaluate the vascular anatomy.  CONTRAST:  70 mL OMNIPAQUE IOHEXOL 350 MG/ML SOLN  COMPARISON:  PA and lateral chest earlier this same day, 12/10/2013 and 03/20/2014  FINDINGS: No pulmonary embolus is identified. Marked cardiomegaly is seen. No pleural or pericardial effusion. Calcific aortic and coronary atherosclerosis is noted. Pacing device is noted. No axillary, hilar or mediastinal lymphadenopathy.  Extensive pulmonary fibrotic change is identified with some honeycombing seen in the lung bases and coarsening of the pulmonary interstitium throughout. There is a somewhat more focal airspace disease and ground glass attenuation in the right upper lobe. No pneumothorax is noted.  Visualized upper abdomen demonstrates bowel loops outside the abdominal cavity to the left of midline posteriorly. No lytic or sclerotic bony lesion is identified.  Review of the MIP images confirms the above findings.  IMPRESSION: Negative for pulmonary embolus.  Pulmonary fibrosis with more focal ground-glass attenuation in the right upper lobe which could represent active inflammation or infection.  Marked cardiomegaly.  Partial visualization of extra-abdominal bowel to the left of midline posteriorly likely due to a lumbar hernia which is not imaged on study.   Electronically Signed   By: Inge Rise M.D.   On: 02/19/2015 19:50    Other results: EKG: Atrial sensed ventricular pacing.   Assessment & Plan by Problem: Principal Problem:   Syncope Active Problems:   Gait disorder   Cough   Alzheimer's disease   Osteoarthritis   Muscle weakness (generalized)   Protein-calorie malnutrition, severe   Chronic diastolic CHF (congestive heart failure)   Sciatic pain   CKD (chronic kidney disease) stage 3, GFR 30-59 ml/min   Pulmonary fibrosis  81 yo female with diastolic CHF, gait disorder, dementia, gait imbalance here with syncope and cough/sob.   Syncope/fall - lasted less than 1 min, patient has  baseline gait disorder, has chronic back and L thigh/hip pain, difficulty ambulating. Could also be precipitated by vicodin she took in morning. vicodin was increased recently 2 AM, 1 morning, 1 afternoon. Daughter stated patient has been feeling more drowsy and had worsening of her balance with the change. Her PO intake has been also low for last 1 day.  Other ddx includes: Syncope could be related to the coughing episodes causing vaso-vagal symptoms. Could also be orthostatic as she is dry on exam. This could also be from hypoxia related. Has chronic pulm fibrosis that was seen first on imaging on 2009. Hasn't been worked up at all in the past, daughter was not aware of it.  - will monitor on tele. Has pacemaker that was interrogated showing no recent events. Had 6 second episode of atrial tachycardia at 160 each per min on 01/09/2015.  - check ortho stats, will give her small amount of fluid as she dry on exam without overshooting it as she has CHF. - will try to get more hx from the daughter. Couldn't reach her. - oxygen to keep sats >92%. Continuous pulse ox - will get PT to evaluate her.  - trend trops. Repeat ECHO.  Elevated trop - 0.12 - trop was not done at ED. Patient denies any chest pain but her hx of unclear with severe dementia.  - discussed with cardiology fellow Dr. Elias Else after trop resulted. He recommended repeating ECHO, trending trops, full dose asa. If trop keeps going up to about 0.4 to 0.5 then he recommended heparin drip. - he recommended cardiology consult in AM if trop stays up or there are changes on ECHO.  Cough with SOB - likely 2/2 to chronic pulm fibrosis - with mild clear sputum production. With transient low O2,  but recovered apparently now on room air. Venous ABG showed low PO2. CXR shows diffuse chornic interstitial changes. ED provider checked ddimer which was elevated, CT angio was done which was negative for PE but showed pulm fibrosis with ground glass changes.   - no fever or wbc count but she is elderly so those are not always present, but overall pneumonia seems less likely. She has baseline dementia so hx is hard to obtain. I think this is more of her pulmonary fibrosis/ILD that's chronic which is causing the cough and SOB. Perhaps she is having a ILD flare. Hypoxia O2 could be from exertion or low resp drive from vicodin. Also could be aspiration pneumonitis/pneumonia.   - We will give her some fluid and repeat CXR to see if there is any new infiltrate on CXR suggestive PNA.  - cont pulse ox.    Pulm Fibrosis - first seen on 2009 imaging. Micro-aspiration can be a cause of pulm fibrosis per ongoing research but no final consensus has been met for actual etiology.  Daughter wasn't aware of this diagnosis before. No workup was done as far as I can tell on EMR.  - consider high res CT if daughter wants to be aggressive. Will likely need O2 at home as o2 has been show to reduce mortality. Will need to get PFT too.  - consider serologies: ANA, RF, serum ACE level, ANCA. ESR can be done but it would be high in the setting of OA. Other labs would include sjogern's, SCL.  - she has MCP deformities, contraction on hands -no erosion seen on Hand xray 06/2013. May have seronegative rheumatoid arthritis? Can consider rheumatology referral or outpatient workup for this?  CKD stage 3 with questionable AKI - baseline has been ranging from 0.8 upto 1.2's. Last 1 year ago was 1.07, now 1.22 - will give her some fluid. Will check basic AKI labs: UA was normal. Will do strict i/os, give her 50cc/hr NS as she is dry on exam, will have to be careful with her hx of CHF - hold lasix.  Chronic diastolic CHF - last echo 04/6811, 65-70% EF mod concentric hypertrophy, mild pulm HTN,  - lasix 25m daily at home. Will hold as giving her fluid now given she is dry on exam. -cont asa  Morbitz type II second degree AV block - on pacemaker.   Dementia - cont aricept +  celexa  Spinal stenosis and sciatic pain - cont vicodin. Will have to be careful as it can make her mental status worse and cause resp inhibition with her advanced age and health condition.  Severe protein malnutrition - nutrition consult, has temple wasting on exam, albumin 3.0 - cont boost breeze  Osteoporosis -Had rib fx and leg fx.   - cont calcium.   Code: wants CPR but no ventilator. Pressors are ok. Discussed with daughter who is next to kin  NPO for now until swallow eval done.  Dispo: Disposition is deferred at this time, awaiting improvement of current medical problems. Anticipated discharge in approximately 1-2 day(s).   The patient does have a current PCP (Janifer Adie MD) and [facility].  The patient does have transportation limitations that hinder transportation to clinic appointments.  Signed: TDellia Nims MD 02/25/2015, 11:47 PM

## 2015-02-12 NOTE — ED Provider Notes (Addendum)
CSN: [id number]     Arrival date & time Mar 14, 2015  1515 History   First MD Initiated Contact with Patient 2015-03-14 1548     Chief Complaint  Patient presents with  . Near Syncope     (Consider location/radiation/quality/duration/timing/severity/associated sxs/prior Treatment) HPI Level V caveat dementia history is obtained from patient's daughter. Patient has been coughing for the past 2 weeks. This morning she coughed up clear sputum with slight amount of blood mixed in. Medially prior to coming here patient suffered syncopal event lasting 1 minute. Her daughter reports she's had diminished appetite over the past several weeks. No vomiting no known fever. Patient does suffer from chronic low back pain she was treated with 2 Vicodin tablets this morning for back pain no other associated symptoms. Past Medical History  Diagnosis Date  . Osteoporosis   . Hypercholesteremia   . Orthopedic aftercare for healing traumatic leg fracture   . Rib fracture   . Osteoarthritis   . Spinal stenosis   . DEMENTIA   . CHF (congestive heart failure)   . Mobitz II 11-2013    s/p pacemaker   Past Surgical History  Procedure Laterality Date  . Hernia repair    . Orthopedic surgery    . Pacemaker insertion  12-09-2013    STJ Assurity dual chamber pacemaker placed by Dr Johney Frame  . Permanent pacemaker insertion N/A 12/09/2013    Procedure: PERMANENT PACEMAKER INSERTION;  Surgeon: Gardiner Rhyme, MD;  Location: [facility];  Service: Cardiovascular;  Laterality: N/A;   History reviewed. No pertinent family history. History  Substance Use Topics  . Smoking status: Never Smoker   . Smokeless tobacco: Never Used  . Alcohol Use: No   OB History    No data available     Review of Systems  Unable to perform ROS Constitutional: Positive for unexpected weight change.  Respiratory: Positive for cough.        Hemoptysis  Musculoskeletal: Positive for back pain.       Chronic back pain   unable to  perform complete review of systems due to dementia    Allergies  Lyrica  Home Medications   Prior to Admission medications   Medication Sig Start Date End Date Taking? Authorizing Provider  acetaminophen (TYLENOL) 325 MG tablet Take 650 mg by mouth at bedtime.    Yes Historical Provider, MD  aspirin 81 MG chewable tablet Chew 81 mg by mouth daily.   Yes Historical Provider, MD  Calcium Carb-Cholecalciferol 600-400 MG-UNIT TABS Take 1 tablet by mouth daily.   Yes Historical Provider, MD  citalopram (CELEXA) 10 MG tablet Take 10 mg by mouth at bedtime.   Yes Historical Provider, MD  donepezil (ARICEPT) 10 MG tablet Take 10 mg by mouth at bedtime.   Yes Historical Provider, MD  furosemide (LASIX) 20 MG tablet Take 1 tablet (20 mg total) by mouth daily. 07/21/13  Yes Ejiroghene E Emokpae, MD  gabapentin (NEURONTIN) 100 MG capsule Take 100 mg by mouth 2 (two) times daily.   Yes Historical Provider, MD  HYDROcodone-acetaminophen (NORCO/VICODIN) 5-325 MG per tablet Take 1 tablet by mouth 3 (three) times daily.   Yes Historical Provider, MD  lidocaine (LIDODERM) 5 % Place 1 patch onto the skin as needed (for pain). AS NEEDED FOR PAIN. Remove & Discard patch within 12 hours or as directed by MD   Yes Historical Provider, MD  menthol-thymol (ABSORBINE JR) LIQD Apply 1 application topically every 8 (eight) hours as needed (for  pain).   Yes Historical Provider, MD  Multiple Vitamin (MULTIVITAMIN WITH MINERALS) TABS Take 1 tablet by mouth daily.   Yes Historical Provider, MD  Nutritional Supplements (BOOST BREEZE) LIQD Take 1 Can by mouth daily.   Yes Historical Provider, MD   BP 115/72 mmHg  Pulse 87  Temp(Src) 97.9 F (36.6 C) (Rectal)  Resp 20  SpO2 100% Physical Exam  Constitutional:  Frail, cachectic, chronically ill-appearing  HENT:  Head: Normocephalic and atraumatic.  Eyes: Conjunctivae are normal. Pupils are equal, round, and reactive to light.  Neck: Neck supple. No tracheal deviation  present. No thyromegaly present.  Cardiovascular: Normal rate and regular rhythm.   No murmur heard. Pulmonary/Chest: Effort normal.  Mild explored wheezes  Abdominal: Soft. Bowel sounds are normal. She exhibits no distension. There is no tenderness.  Genitourinary: Guaiac positive stool.  Rectal normal tone brown stool Hemoccult negative  Musculoskeletal: Normal range of motion. She exhibits no edema or tenderness.  Neurological: She is alert. Coordination normal.  Follow simple commands moves all extremities  Skin: Skin is warm and dry. No rash noted.  Psychiatric: She has a normal mood and affect.  Nursing note and vitals reviewed.   ED Course  Procedures (including critical care time) Labs Review Labs Reviewed  COMPREHENSIVE METABOLIC PANEL  CBC WITH DIFFERENTIAL/PLATELET  URINALYSIS, ROUTINE W REFLEX MICROSCOPIC  D-DIMER, QUANTITATIVE  BLOOD GAS, VENOUS  POC OCCULT BLOOD, ED    Imaging Review No results found.   EKG Interpretation   Date/Time:  Monday February 12 2015 15:37:02 EST Ventricular Rate:  91 PR Interval:  181 QRS Duration: 149 QT Interval:  440 QTC Calculation: 541 R Axis:   -85 Text Interpretation:  Age not entered, assumed to be  81 years old for  purpose of ECG interpretation Atrial-sensed ventricular-paced complexes No  further analysis attempted due to paced rhythm No significant change since  last tracing Confirmed by Ethelda ChickJACUBOWITZ  MD, Michalina Calbert [phone] on 2015/01/09 3:47:03  PM     [employer] Laurence[employer]udes pacemaker was interrogated . She had no events today. She had a 6 second episode of atrial tachycardia at 160 each per minute on 01/09/2015 Results for orders placed or performed during the hospital encounter of 10/11/15  Comprehensive metabolic panel  Result Value Ref Range   Sodium 143 135 - 145 mmol/L   Potassium 3.9 3.5 - 5.1 mmol/L   Chloride 111 96 - 112 mmol/L   CO2 20 19 - 32 mmol/L   Glucose, Bld 167 (H) 70 - 99 mg/dL   BUN 18 6 - 23 mg/dL    Creatinine, Ser 1.301.22 (H) 0.50 - 1.10 mg/dL   Calcium 8.7 8.4 - 86.510.5 mg/dL   Total Protein 6.6 6.0 - 8.3 g/dL   Albumin 3.0 (L) 3.5 - 5.2 g/dL   AST 39 (H) 0 - 37 U/L   ALT 37 (H) 0 - 35 U/L   Alkaline Phosphatase 79 39 - 117 U/L   Total Bilirubin 0.7 0.3 - 1.2 mg/dL   GFR calc non Af Amer 40 (L) >90 mL/min   GFR calc Af Amer 46 (L) >90 mL/min   Anion gap 12 5 - 15  CBC with Differential/Platelet  Result Value Ref Range   WBC 6.3 4.0 - 10.5 K/uL   RBC 5.23 (H) 3.87 - 5.11 MIL/uL   Hemoglobin 14.5 12.0 - 15.0 g/dL   HCT 78.443.0 69.636.0 - 29.546.0 %   MCV 82.2 78.0 - 100.0 fL   MCH 27.7 26.0 - 34.0  pg   MCHC 33.7 30.0 - 36.0 g/dL   RDW 21.3 08.6 - 57.8 %   Platelets 158 150 - 400 K/uL   Neutrophils Relative % 31 (L) 43 - 77 %   Neutro Abs 2.0 1.7 - 7.7 K/uL   Lymphocytes Relative 58 (H) 12 - 46 %   Lymphs Abs 3.7 0.7 - 4.0 K/uL   Monocytes Relative 8 3 - 12 %   Monocytes Absolute 0.5 0.1 - 1.0 K/uL   Eosinophils Relative 3 0 - 5 %   Eosinophils Absolute 0.2 0.0 - 0.7 K/uL   Basophils Relative 0 0 - 1 %   Basophils Absolute 0.0 0.0 - 0.1 K/uL  Urinalysis, Routine w reflex microscopic  Result Value Ref Range   Color, Urine YELLOW YELLOW   APPearance CLEAR CLEAR   Specific Gravity, Urine 1.022 1.005 - 1.030   pH 5.5 5.0 - 8.0   Glucose, UA NEGATIVE NEGATIVE mg/dL   Hgb urine dipstick NEGATIVE NEGATIVE   Bilirubin Urine NEGATIVE NEGATIVE   Ketones, ur NEGATIVE NEGATIVE mg/dL   Protein, ur NEGATIVE NEGATIVE mg/dL   Urobilinogen, UA 0.2 0.0 - 1.0 mg/dL   Nitrite NEGATIVE NEGATIVE   Leukocytes, UA NEGATIVE NEGATIVE  D-dimer, quantitative  Result Value Ref Range   D-Dimer, Quant 1.47 (H) 0.00 - 0.48 ug/mL-FEU  POC occult blood, ED Provider will collect  Result Value Ref Range   Fecal Occult Bld NEGATIVE NEGATIVE  I-Stat venous blood gas, ED  Result Value Ref Range   pH, Ven 7.422 (H) 7.250 - 7.300   pCO2, Ven 35.2 (L) 45.0 - 50.0 mmHg   pO2, Ven 15.0 (LL) 30.0 - 45.0 mmHg    Bicarbonate 22.9 20.0 - 24.0 mEq/L   TCO2 24 0 - 100 mmol/L   O2 Saturation 21.0 %   Acid-base deficit 1.0 0.0 - 2.0 mmol/L   Sample type VENOUS    Comment NOTIFIED PHYSICIAN    Dg Chest 2 View  March 01, 2015   CLINICAL DATA:  Cough and wheezing for 2 days  EXAM: CHEST  2 VIEW  COMPARISON:  03/20/2014  FINDINGS: Cardiac shadow is mildly prominent. A pacing device is again seen and stable. Diffuse chronic interstitial changes are noted throughout both lungs. Some slight increased density in the right upper lobe may represent progression of the chronic changes or possibly acute on chronic infiltrate. No sizable effusion is seen.  IMPRESSION: Diffuse chronic interstitial changes. There is some suggestion of acute on chronic infiltrate in the right upper lobe.   Electronically Signed   By: Alcide Clever M.D.   On: 2015/03/01 17:01   Ct Angio Chest Pe W/cm &/or Wo Cm  Mar 01, 2015   CLINICAL DATA:  Elevated D-dimer.  Near syncope.  EXAM: CT ANGIOGRAPHY CHEST WITH CONTRAST  TECHNIQUE: Multidetector CT imaging of the chest was performed using the standard protocol during bolus administration of intravenous contrast. Multiplanar CT image reconstructions and MIPs were obtained to evaluate the vascular anatomy.  CONTRAST:  70 mL OMNIPAQUE IOHEXOL 350 MG/ML SOLN  COMPARISON:  PA and lateral chest earlier this same day, 12/10/2013 and 03/20/2014  FINDINGS: No pulmonary embolus is identified. Marked cardiomegaly is seen. No pleural or pericardial effusion. Calcific aortic and coronary atherosclerosis is noted. Pacing device is noted. No axillary, hilar or mediastinal lymphadenopathy.  Extensive pulmonary fibrotic change is identified with some honeycombing seen in the lung bases and coarsening of the pulmonary interstitium throughout. There is a somewhat more focal airspace disease and ground glass  attenuation in the right upper lobe. No pneumothorax is noted.  Visualized upper abdomen demonstrates bowel loops outside the  abdominal cavity to the left of midline posteriorly. No lytic or sclerotic bony lesion is identified.  Review of the MIP images confirms the above findings.  IMPRESSION: Negative for pulmonary embolus.  Pulmonary fibrosis with more focal ground-glass attenuation in the right upper lobe which could represent active inflammation or infection.  Marked cardiomegaly.  Partial visualization of extra-abdominal bowel to the left of midline posteriorly likely due to a lumbar hernia which is not imaged on study.   Electronically Signed   By: Drusilla Kanner M.D.   On: 02/20/2015 19:50  chest xray viewed by me  MDM  In light of cough, wheezes on lung exam, hypoxia and CT and chest x-ray findings we'll treat for community acquired pneumonia. Telemetry as patient had syncopal event today I spoke with Dr.Kennerly and 23 hour observation, telemetry, IV antibiotics,oxygen Final diagnoses:  Cough   Dx #1 community acquired pneumonia #2 syncope #3 hemoptysis #4 hypoxia #5 hyperglycemia     Doug Sou, MD 02/04/2015 2022  Doug Sou, MD 02/27/15 [phone]

## 2015-02-12 NOTE — ED Notes (Signed)
Pt brought to ED via EMS due to near syncopal episode. Pt was apparently standing at top of stairs when she became weak and began to fall, pt states it was her arthritis that made her get really weak. Pt alert and oriented. Speech is difficult to understand. Pt has pacemaker. 20  g placed by EMS in left forearm.

## 2015-02-12 NOTE — ED Notes (Signed)
Pt has returned from radiology.  

## 2015-02-12 NOTE — ED Provider Notes (Deleted)
CSN: [id number]     Arrival date & time 02/21/2015  1515 History   First MD Initiated Contact with Patient 02/11/2015 1548     Chief Complaint  Patient presents with  . Near Syncope     (Consider location/radiation/quality/duration/timing/severity/associated sxs/prior Treatment) HPI  Past Medical History  Diagnosis Date  . Osteoporosis   . Hypercholesteremia   . Orthopedic aftercare for healing traumatic leg fracture   . Rib fracture   . Osteoarthritis   . Spinal stenosis   . DEMENTIA   . CHF (congestive heart failure)   . Mobitz II 11-2013    s/p pacemaker   Past Surgical History  Procedure Laterality Date  . Hernia repair    . Orthopedic surgery    . Pacemaker insertion  12-09-2013    STJ Assurity dual chamber pacemaker placed by Dr Johney Frame  . Permanent pacemaker insertion N/A 12/09/2013    Procedure: PERMANENT PACEMAKER INSERTION;  Surgeon: Gardiner Rhyme, MD;  Location: [facility];  Service: Cardiovascular;  Laterality: N/A;   History reviewed. No pertinent family history. History  Substance Use Topics  . Smoking status: Never Smoker   . Smokeless tobacco: Never Used  . Alcohol Use: No   OB History    No data available     Review of Systems    Allergies  Lyrica  Home Medications   Prior to Admission medications   Medication Sig Start Date End Date Taking? Authorizing Provider  acetaminophen (TYLENOL) 325 MG tablet Take 650 mg by mouth at bedtime.    Yes Historical Provider, MD  aspirin 81 MG chewable tablet Chew 81 mg by mouth daily.   Yes Historical Provider, MD  Calcium Carb-Cholecalciferol 600-400 MG-UNIT TABS Take 1 tablet by mouth daily.   Yes Historical Provider, MD  citalopram (CELEXA) 10 MG tablet Take 10 mg by mouth at bedtime.   Yes Historical Provider, MD  donepezil (ARICEPT) 10 MG tablet Take 10 mg by mouth at bedtime.   Yes Historical Provider, MD  furosemide (LASIX) 20 MG tablet Take 1 tablet (20 mg total) by mouth daily. 07/21/13  Yes  Ejiroghene E Emokpae, MD  gabapentin (NEURONTIN) 100 MG capsule Take 100 mg by mouth 2 (two) times daily.   Yes Historical Provider, MD  HYDROcodone-acetaminophen (NORCO/VICODIN) 5-325 MG per tablet Take 1 tablet by mouth 3 (three) times daily.   Yes Historical Provider, MD  lidocaine (LIDODERM) 5 % Place 1 patch onto the skin as needed (for pain). AS NEEDED FOR PAIN. Remove & Discard patch within 12 hours or as directed by MD   Yes Historical Provider, MD  menthol-thymol (ABSORBINE JR) LIQD Apply 1 application topically every 8 (eight) hours as needed (for pain).   Yes Historical Provider, MD  Multiple Vitamin (MULTIVITAMIN WITH MINERALS) TABS Take 1 tablet by mouth daily.   Yes Historical Provider, MD  Nutritional Supplements (BOOST BREEZE) LIQD Take 1 Can by mouth daily.   Yes Historical Provider, MD   BP 115/72 mmHg  Pulse 87  Temp(Src) 97.8 F (36.6 C) (Oral)  Resp 20  SpO2 100% Physical Exam  ED Course  Procedures (including critical care time) Labs Review Labs Reviewed - No data to display  Imaging Review No results found.   EKG Interpretation   Date/Time:  Monday February 12 2015 15:37:02 EST Ventricular Rate:  91 PR Interval:  181 QRS Duration: 149 QT Interval:  440 QTC Calculation: 541 R Axis:   -85 Text Interpretation:  Age not entered, assumed to  be  81 years old for  purpose of ECG interpretation Atrial-sensed ventricular-paced complexes No  further analysis attempted due to paced rhythm No significant change since  last tracing Confirmed by Ethelda ChickJACUBOWITZ  MD, Jefte Carithers [phone] on 02/03/2015 3:47:03  PM      MDM   Final diagnoses:  None   please delete. Duplicate note  Doug SouSam Krish Bailly, MD 02/13/15 [phone]

## 2015-02-12 NOTE — ED Notes (Signed)
PA-C [place] was given the patients critical Blood gas, venous results.

## 2015-02-13 ENCOUNTER — Observation Stay (HOSPITAL_COMMUNITY): Payer: Medicare (Managed Care)

## 2015-02-13 DIAGNOSIS — R55 Syncope and collapse: Secondary | ICD-10-CM | POA: Diagnosis not present

## 2015-02-13 LAB — CBC
HCT: 31.9 % — ABNORMAL LOW (ref 36.0–46.0)
Hemoglobin: 10.3 g/dL — ABNORMAL LOW (ref 12.0–15.0)
MCH: 28.5 pg (ref 26.0–34.0)
MCHC: 32.3 g/dL (ref 30.0–36.0)
MCV: 88.4 fL (ref 78.0–100.0)
Platelets: 183 10*3/uL (ref 150–400)
RBC: 3.61 MIL/uL — ABNORMAL LOW (ref 3.87–5.11)
RDW: 14 % (ref 11.5–15.5)
WBC: 6.6 10*3/uL (ref 4.0–10.5)

## 2015-02-13 LAB — BASIC METABOLIC PANEL
ANION GAP: 12 (ref 5–15)
BUN: 18 mg/dL (ref 6–23)
CALCIUM: 7.9 mg/dL — AB (ref 8.4–10.5)
CO2: 17 mmol/L — ABNORMAL LOW (ref 19–32)
Chloride: 113 mmol/L — ABNORMAL HIGH (ref 96–112)
Creatinine, Ser: 1.15 mg/dL — ABNORMAL HIGH (ref 0.50–1.10)
GFR calc Af Amer: 49 mL/min — ABNORMAL LOW (ref >90)
GFR, EST NON AFRICAN AMERICAN: 42 mL/min — AB (ref >90)
Glucose, Bld: 108 mg/dL — ABNORMAL HIGH (ref 70–99)
Potassium: 3.9 mmol/L (ref 3.5–5.1)
SODIUM: 142 mmol/L (ref 135–145)

## 2015-02-13 LAB — TROPONIN I
TROPONIN I: 0.14 ng/mL — AB (ref <0.031)
Troponin I: 0.1 ng/mL — ABNORMAL HIGH (ref <0.031)

## 2015-02-13 NOTE — Progress Notes (Signed)
UR completed 

## 2015-02-13 NOTE — Progress Notes (Signed)
  Echocardiogram 2D Echocardiogram has been performed.  Janalyn HarderWest, Burch Marchuk R 02/13/2015, 9:24 AM

## 2015-02-13 NOTE — Progress Notes (Signed)
MBSS complete. Full report located under chart review in imaging section. Kendal HymenBonnie Izzy Doubek MA CCC-SLP

## 2015-02-13 NOTE — Progress Notes (Signed)
   02/13/15 1100  SLP G-Codes **NOT FOR INPATIENT CLASS**  Functional Assessment Tool Used (clinical judgement)  Functional Limitations Swallowing  Swallow Current Status (Z6109(G8996) CI  Swallow Goal Status (U0454(G8997) CI  SLP Evaluations  $ SLP Speech Visit 1 Procedure  SLP Evaluations  $BSS Swallow 1 Procedure  $Swallowing Treatment 1 Procedure

## 2015-02-13 NOTE — Evaluation (Cosign Needed)
Clinical/Bedside Swallow Evaluation Patient Details  Name: Mary Hartman MRN: [id number] Date of Birth: [birth year]  Today's Date: 02/13/2015 Time: SLP Start Time (ACUTE ONLY): 1121 SLP Stop Time (ACUTE ONLY): 1132 SLP Time Calculation (min) (ACUTE ONLY): 11 min  Past Medical History:  Past Medical History  Diagnosis Date  . Osteoporosis   . Hypercholesteremia   . Orthopedic aftercare for healing traumatic leg fracture   . Rib fracture   . Osteoarthritis   . Spinal stenosis   . DEMENTIA   . CHF (congestive heart failure)   . Mobitz II 11-2013    s/p pacemaker  . CAP (community acquired pneumonia) 17-Feb-2015    Mary Hartman February 17, 2015   Past Surgical History:  Past Surgical History  Procedure Laterality Date  . Hernia repair    . Orthopedic surgery    . Pacemaker insertion  12-09-2013    STJ Assurity dual chamber pacemaker placed by Dr Mary Hartman  . Permanent pacemaker insertion N/A 12/09/2013    Procedure: PERMANENT PACEMAKER INSERTION;  Surgeon: Mary Rhyme, MD;  Location: [facility];  Service: Cardiovascular;  Laterality: N/A;   HPI:  Pt is an 81 y/o female with Osteoporesis, rib fx, leg fx, OA, dementia, morbitz II s/p pacemaker, CHF here with coughing for last 2 weeks, with clear sputum with some blood mixed with sputum per ED record. Had a witnessed syncope lasting 1 minute per daughter. Takes pain med for chronic back pain (took 2 vicodin tabs this morning). This has been recently increased due to increased pain related to her OA on both hips, legs. She has been having some balance problems last few days, usually walks to the bathroom on her own but hasn't been able to do that today. Then she had the syncopal event. No n/v but had decreased appetite for 1 day. During our interview, patient said she is hungry. Her other complaint was b/l hip pain going down her legs, has hx of spinal stenosis and takes pain meds. States using walker at home. No fever/chills, diarrhea. Denies any cp or  sob. CXR revealed diffuse chronic interstitial changes. There is some suggestion of acute on chronic infiltrate in the right upper lobe.    Assessment / Plan / Recommendation Clinical Impression   Goal of session was to determine pt readiness for PO trials. PO trials with thin liquids and purees did not elicit s/s of aspiration. PO trials with solid consistencies were not attempted; pt is edentulous. Per caregiver report pt does not use dentures and was previously on a dys 2/ thin liquid diet. SLP noted pt's respiratory rate increased when pt used fast rate during PO trials. Recommend dys 2/ thin liquid diet; slow rates and small bites during meals, sit upright during meals. Speech will follow-up with objective swallow study due to pt's current medical status; MD suspects micro-aspiration could be cause of pulmonary fibrosis. Discussed with pt's daughter diet recommendation and compensatory strategies.      Aspiration Risk  Mild    Diet Recommendation     Liquid Administration via: Straw;Cup Medication Administration: Whole meds with puree Supervision: Full supervision/cueing for compensatory strategies Compensations: Small sips/bites;Slow rate Postural Changes and/or Swallow Maneuvers: Seated upright 90 degrees    Other  Recommendations Recommended Consults: MBS Oral Care Recommendations: Oral care BID   Follow Up Recommendations       Frequency and Duration min 2x/week  2 weeks   Pertinent Vitals/Pain     SLP Swallow Goals     Swallow Study Prior  Functional Status       General HPI: Pt is an 81 y/o female with Osteoporesis, rib fx, leg fx, OA, dementia, morbitz II s/p pacemaker, CHF here with coughing for last 2 weeks, with clear sputum with some blood mixed with sputum per ED record. Had a witnessed syncope lasting 1 minute per daughter. Takes pain med for chronic back pain (took 2 vicodin tabs this morning). This has been recently increased due to increased pain related to her  OA on both hips, legs. She has been having some balance problems last few days, usually walks to the bathroom on her own but hasn't been able to do that today. Then she had the syncopal event. No n/v but had decreased appetite for 1 day. During our interview, patient said she is hungry. Her other complaint was b/l hip pain going down her legs, has hx of spinal stenosis and takes pain meds. States using walker at home. No fever/chills, diarrhea. Denies any cp or sob. CXR revealed diffuse chronic interstitial changes. There is some suggestion of acute on chronic infiltrate in the right upper lobe.  Type of Study: Bedside swallow evaluation Diet Prior to this Study: Dysphagia 2 (chopped);Thin liquids Temperature Spikes Noted: No Respiratory Status: Nasal cannula Behavior/Cognition: Alert;Cooperative;Pleasant mood Oral Cavity - Dentition: Edentulous Self-Feeding Abilities: Able to feed self;Needs set up Patient Positioning: Upright in chair Baseline Vocal Quality: Low vocal intensity    Oral/Motor/Sensory Function Overall Oral Motor/Sensory Function: Appears within functional limits for tasks assessed Labial ROM: Within Functional Limits Labial Symmetry: Within Functional Limits Lingual ROM: Within Functional Limits Lingual Symmetry: Within Functional Limits Facial ROM: Within Functional Limits Facial Symmetry: Within Functional Limits   Ice Chips Ice chips: Not tested   Thin Liquid Thin Liquid: Within functional limits Presentation: Cup;Straw    Nectar Thick Nectar Thick Liquid: Not tested   Honey Thick Honey Thick Liquid: Not tested   Puree Puree: Within functional limits Presentation: Spoon   Solid   GO    Solid: Not tested       Mary Hartman, Mary Hartman 02/13/2015,11:56 AM

## 2015-02-13 NOTE — Evaluation (Signed)
Physical Therapy Evaluation Patient Details Name: Mary Hartman MRN: [id number] DOB: 1928/07/27 Today's Date: 02/13/2015   History of Present Illness  81 yo female with onset of syncope and cough with increased weakness, discovered to have chronic pulm fibrosis, malnutrition and possible AKI.  Clinical Impression  Pt was seen for evaluation with a limited ability from pt to assist information.  Her plan for now is SNF due to very low O2 sats with gait and her PLOF is not clear, nor is information available about direct assistance at home.  Will continue care for this pt to increase her stability and endurance toward home.    Follow Up Recommendations SNF;Supervision/Assistance - 24 hour    Equipment Recommendations  Other (comment) (Not sure of needs due to lack of information)    Recommendations for Other Services       Precautions / Restrictions Precautions Precautions: Fall (telemetry) Restrictions Weight Bearing Restrictions: No      Mobility  Bed Mobility Overal bed mobility: Needs Assistance Bed Mobility: Supine to Sit     Supine to sit: Mod assist     General bed mobility comments: needs assist to scoot out hips and under trunk  Transfers Overall transfer level: Needs assistance Equipment used: Rolling walker (2 wheeled);1 person hand held assist Transfers: Sit to/from [employer] Sit to Stand: Min assist Stand pivot transfers: Min assist       General transfer comment: reminders for hand placement and safety with turns  Ambulation/Gait Ambulation/Gait assistance: Min assist Ambulation Distance (Feet): 14 Feet Assistive device: Rolling walker (2 wheeled);1 person hand held assist Gait Pattern/deviations: Step-to pattern;Decreased stride length;Shuffle;Wide base of support;Trunk flexed Gait velocity: reduced Gait velocity interpretation: Below normal speed for age/gender General Gait Details: Pt is unsure of how to maneuver walker and to  stay inside, seems unfamiliar to her  Stairs            Wheelchair Mobility    Modified Rankin (Stroke Patients Only)       Balance Overall balance assessment: Needs assistance Sitting-balance support: Bilateral upper extremity supported;Feet supported Sitting balance-Leahy Scale: Fair   Postural control: Posterior lean Standing balance support: Bilateral upper extremity supported Standing balance-Leahy Scale: Poor Standing balance comment: reminders to control back ward lean                             Pertinent Vitals/Pain Pain Assessment: No/denies pain    Home Living Family/patient expects to be discharged to:: Unsure Living Arrangements: Children Available Help at Discharge: Family                  Prior Function           Comments: no family to get information     Hand Dominance        Extremity/Trunk Assessment   Upper Extremity Assessment: Generalized weakness           Lower Extremity Assessment: Generalized weakness      Cervical / Trunk Assessment: Kyphotic  Communication   Communication: No difficulties  Cognition Arousal/Alertness: Lethargic Behavior During Therapy: Flat affect Overall Cognitive Status: History of cognitive impairments - at baseline       Memory: Decreased recall of precautions;Decreased short-term memory              General Comments General comments (skin integrity, edema, etc.): Pt is somewhat confused and does not have information about PLOF.  Had drop of sats  from 100% to 82% with short walk without O2 via nasal cannula, reported to nsg.    Exercises        Assessment/Plan    PT Assessment Patient needs continued PT services  PT Diagnosis Generalized weakness   PT Problem List Decreased strength;Decreased range of motion;Decreased activity tolerance;Decreased balance;Decreased mobility;Decreased coordination;Decreased cognition;Decreased knowledge of use of DME;Decreased  safety awareness;Decreased knowledge of precautions;Cardiopulmonary status limiting activity  PT Treatment Interventions DME instruction;Gait training;Functional mobility training;Therapeutic activities;Therapeutic exercise;Balance training;Neuromuscular re-education;Cognitive remediation;Patient/family education   PT Goals (Current goals can be found in the Care Plan section) Acute Rehab PT Goals Patient Stated Goal: Did not state PT Goal Formulation: Patient unable to participate in goal setting Time For Goal Achievement: 02/27/15 Potential to Achieve Goals: Fair    Frequency Min 3X/week   Barriers to discharge Other (comment) (dependent for all mobility with reduced O2 upon sitting)      Co-evaluation               End of Session Equipment Utilized During Treatment: Gait belt Activity Tolerance: Patient limited by fatigue Patient left: in chair;with call bell/phone within reach;with chair alarm set;with nursing/sitter in room Nurse Communication: Mobility status    Functional Assessment Tool Used: clinical judgement Functional Limitation: Mobility: Walking and moving around Mobility: Walking and Moving Around Current Status [phone](G8978): At least 40 percent but less than 60 percent impaired, limited or restricted Mobility: Walking and Moving Around Goal Status [phone](G8979): At least 1 percent but less than 20 percent impaired, limited or restricted    Time: 1001-1027 PT Time Calculation (min) (ACUTE ONLY): 26 min   Charges:   PT Evaluation $Initial PT Evaluation Tier I: 1 Procedure PT Treatments $Gait Training: 8-22 mins   PT G Codes:   PT G-Codes **NOT FOR INPATIENT CLASS** Functional Assessment Tool Used: clinical judgement Functional Limitation: Mobility: Walking and moving around Mobility: Walking and Moving Around Current Status ([id number](G8978): At least 40 percent but less than 60 percent impaired, limited or restricted Mobility: Walking and Moving Around Goal Status [phone](G8979):  At least 1 percent but less than 20 percent impaired, limited or restricted    Ivar DrapeStout, Loman Logan E 02/13/2015, 12:09 PM   Samul Dadauth Krystin Keeven, PT MS Acute Rehab Dept. Number: [phone]

## 2015-02-13 NOTE — Progress Notes (Signed)
Subjective: Mary Hartman feels "good" this morning. She is hungry. She does not feel short of breath.  Objective: Vital signs in last 24 hours: Filed Vitals:   10-Mar-2015 2250 03-10-2015 2254 02/13/15 0235 02/13/15 0651  BP: 111/67 104/63 147/86 158/78  Pulse: 83 79 67 65  Temp:   98 F (36.7 C) 97.8 F (36.6 C)  TempSrc:   Oral Oral  Resp:   18 20  Height:      Weight:    89 lb 8.1 oz (40.6 kg)  SpO2:   100% 100%   Weight change:   Intake/Output Summary (Last 24 hours) at 02/13/15 0909 Last data filed at 02/13/15 0600  Gross per 24 hour  Intake   1030 ml  Output      0 ml  Net   1030 ml   Physical Exam: Appearance: in NAD, frail-appearing, finishing up her work with PT, O2 [place] in place at 2 L HEENT: Happy/AT, slightly dry MM, PERRL, EOMi, normal range of motion in neck, no JVD Heart: RRR, normal S1S2, no MRG Lungs: CTAB, no wheezes, crackles at both bases Abdomen: BS+, soft, nontender, nondistended Musculoskeletal: tenderness at hips to palpation, MCP contracture L>R, no edema Neurologic: oriented only to self, otherwise grossly intact Skin: warm and dry, no rashes or lesions  Lab Results: Basic Metabolic Panel:  Recent Labs Lab Mar 10, 2015 1641 02/13/15 0411  NA 143 142  K 3.9 3.9  CL 111 113*  CO2 20 17*  GLUCOSE 167* 108*  BUN 18 18  CREATININE 1.22* 1.15*  CALCIUM 8.7 7.9*   Liver Function Tests:  Recent Labs Lab 03/10/15 1641  AST 39*  ALT 37*  ALKPHOS 79  BILITOT 0.7  PROT 6.6  ALBUMIN 3.0*   CBC:  Recent Labs Lab 2015-03-10 1641 02/13/15 0550  WBC 6.3 6.6  NEUTROABS 2.0  --   HGB 14.5 10.3*  HCT 43.0 31.9*  MCV 82.2 88.4  PLT 158 183   Cardiac Enzymes:  Recent Labs Lab 03/10/2015 2158 02/13/15 0411  TROPONINI 0.12* 0.14*   D-Dimer:  Recent Labs Lab 03/10/2015 1641  DDIMER 1.47*   Urinalysis:  Recent Labs Lab 03/10/15 1719  COLORURINE YELLOW  LABSPEC 1.022  PHURINE 5.5  GLUCOSEU NEGATIVE  HGBUR NEGATIVE  BILIRUBINUR  NEGATIVE  KETONESUR NEGATIVE  PROTEINUR NEGATIVE  UROBILINOGEN 0.2  NITRITE NEGATIVE  LEUKOCYTESUR NEGATIVE   Studies/Results: Dg Chest 2 View  Mar 10, 2015   CLINICAL DATA:  Cough and wheezing for 2 days  EXAM: CHEST  2 VIEW  COMPARISON:  03/20/2014  FINDINGS: Cardiac shadow is mildly prominent. A pacing device is again seen and stable. Diffuse chronic interstitial changes are noted throughout both lungs. Some slight increased density in the right upper lobe may represent progression of the chronic changes or possibly acute on chronic infiltrate. No sizable effusion is seen.  IMPRESSION: Diffuse chronic interstitial changes. There is some suggestion of acute on chronic infiltrate in the right upper lobe.   Electronically Signed   By: Alcide Clever M.D.   On: 03-10-2015 17:01   Ct Angio Chest Pe W/cm &/or Wo Cm  2015-03-10   CLINICAL DATA:  Elevated D-dimer.  Near syncope.  EXAM: CT ANGIOGRAPHY CHEST WITH CONTRAST  TECHNIQUE: Multidetector CT imaging of the chest was performed using the standard protocol during bolus administration of intravenous contrast. Multiplanar CT image reconstructions and MIPs were obtained to evaluate the vascular anatomy.  CONTRAST:  70 mL OMNIPAQUE IOHEXOL 350 MG/ML SOLN  COMPARISON:  PA and lateral chest earlier this same day, 12/10/2013 and 03/20/2014  FINDINGS: No pulmonary embolus is identified. Marked cardiomegaly is seen. No pleural or pericardial effusion. Calcific aortic and coronary atherosclerosis is noted. Pacing device is noted. No axillary, hilar or mediastinal lymphadenopathy.  Extensive pulmonary fibrotic change is identified with some honeycombing seen in the lung bases and coarsening of the pulmonary interstitium throughout. There is a somewhat more focal airspace disease and ground glass attenuation in the right upper lobe. No pneumothorax is noted.  Visualized upper abdomen demonstrates bowel loops outside the abdominal cavity to the left of midline  posteriorly. No lytic or sclerotic bony lesion is identified.  Review of the MIP images confirms the above findings.  IMPRESSION: Negative for pulmonary embolus.  Pulmonary fibrosis with more focal ground-glass attenuation in the right upper lobe which could represent active inflammation or infection.  Marked cardiomegaly.  Partial visualization of extra-abdominal bowel to the left of midline posteriorly likely due to a lumbar hernia which is not imaged on study.   Electronically Signed   By: Drusilla Kannerhomas  Dalessio M.D.   On: 10/13/15 19:50   Medications: I have reviewed the patient's current medications. Scheduled Meds: . acetaminophen  650 mg Oral QHS  . aspirin  81 mg Oral Daily  . citalopram  10 mg Oral QHS  . donepezil  10 mg Oral QHS  . gabapentin  100 mg Oral BID  . heparin  5,000 Units Subcutaneous 3 times per day   Continuous Infusions:  PRN Meds:.lidocaine, MUSCLE RUB, ondansetron **OR** ondansetron (ZOFRAN) IV Assessment/Plan: Principal Problem:   Syncope Active Problems:   Gait disorder   Cough   Alzheimer's disease   Osteoarthritis   Muscle weakness (generalized)   Protein-calorie malnutrition, severe   Chronic diastolic CHF (congestive heart failure)   Sciatic pain   CKD (chronic kidney disease) stage 3, GFR 30-59 ml/min   Pulmonary fibrosis  [name] is an 81 year old woman with diastolic CHF, dementia and a gait imbalance who presented with syncope and shortness of breath. Her CTA revealed previously diagnosed pulmonary fibrosis, which may explain her syncopal episode and shortness of breath.  Syncopal Episode: Lasted less than 1 minute. No recurrence. Likely precipitated by her underlying pulmonary fibrosis (first noted in 2009, family not aware of diagnosis). Dementia and increased vicodin may also have contributed. Daughter stated patient has been feeling more drowsy and had worsening of her balance with the increased vicodin dose. Her PO intake has been also low for  last day and she is dry on exam. Pacemaker (for mobitz II) was interrogated and showed no recent events. Had 6 second episode of atrial tachycardia at 160 each per min on 01/09/2015. Patient was given a small amount of fluid (with caution due to CHF). Echo revealed LVEF 65-70% with grade 1 diastolic dysfunction. - Telemetry - Oxygen to keep sats >92%. Continuous pulse ox - Appreciate PT evaluation and treatment; recommending SNF - Need goals of care discussion  Elevated Troponin: Cardiology overnight directed repeat echo. Also advised heparin drip and cardiology consult IF patient's troponins elevated to 0.4. Patient's troponins trended down: 0.12-->0.14-->0.10. Patient denies any chest pain. Patient was given home aspirin 325 mg.  Idiopathic Chronic Severe Interstitial Lung Disease: Patient complains of cough productive of mild clear sputum and shortness of breath. May have contributed to syncopal episode. Had transient low O2 noted in ED, but recovered on room air. Venous ABG showed low PO2. Pulmonary fibrosis evident on CXR and CTA chest,  first seen on imaging in 2009, formal diagnosis 2014. Follows with Dr. Lajoyce Corners (orthopaedics) for cortisone injections. May have seronegative RA (MCP deformities on exam, contracture). Afebrile with no white count. Spoke with [facility] Dor[facility] Pe[facility] at [facility]; patient is well-managed without oxygen at home. Has had O2 saturations in the high 90s on all weekly visits over the past 2 months. He believes that this admission had a component of over-reaction by the patient's very anxious daughter. - Cont pulse ox; wean off supplemental oxygen - Determine goals of care, as this will likely be the source of other admissions in the future. Could consider high resolution CT, PFTs and home oxygen along with rheumatologic workup, but would not likely lead to treatment changes  CKD Stage III: At baseline (1.22->1.15) after cautious fluid resuscitation. Dry on initial exam. Normal UA. On lasix 20  mg by mouth daily at home. - Hold home lasix  Chronic Diastolic CHF: Last echo 06/2013, 65-70% EF moderate concentric hypertrophy, mild pulm HTN. Repeat echo showed LVEF 65-70% with grade 1 diastolic dysfunction. - Holding home Lasix  daily - Continue home aspirin 325 mg daily  Mobitz Type II Second Degree AV Block: Has pacemaker (interrogated in ED).  Dementia: Continue home aricept + celexa.  Spinal Stenosis and Sciatic Pain: Recently increased vicodin dose is 5-325 mg by mouth TID. This may be contributing to syncope, worsening mental status; may also be causing respiratory inhibition. - Holding vicodin for now - Lidoderm patch daily (home dose) - Neurontin 100 mg BID (home dose)  Severe Protein Malnutrition: Has temporal wasting on exam, albumin 3.0. - Nutrition consult - Continue boost breeze  Osteoporosis: Had leg fracture 11/2014.  - Continue calcium  Code: wants CPR but no ventilator. Pressors are ok. Discussed with daughter who is next to kin  Diet: Dysphagia 2 diet  Dispo: Disposition is deferred at this time, awaiting improvement of current medical problems.  Anticipated discharge in approximately 2 day(s).   The patient does have a current PCP Jethro Bastos, MD) and does not need an [facility] hospital follow-up appointment after discharge.  The patient does have transportation limitations that hinder transportation to clinic appointments.  .Services Needed at time of discharge: Y = Yes, Blank = No PT:   OT:   RN:   Equipment:   Other:       Dionne Ano, MD 02/13/2015, 9:09 AM

## 2015-02-14 ENCOUNTER — Observation Stay (HOSPITAL_COMMUNITY): Payer: Medicare (Managed Care)

## 2015-02-14 ENCOUNTER — Inpatient Hospital Stay (HOSPITAL_COMMUNITY): Payer: Medicare (Managed Care)

## 2015-02-14 ENCOUNTER — Encounter (HOSPITAL_COMMUNITY): Payer: Self-pay | Admitting: Pulmonary Disease

## 2015-02-14 DIAGNOSIS — E43 Unspecified severe protein-calorie malnutrition: Secondary | ICD-10-CM | POA: Diagnosis present

## 2015-02-14 DIAGNOSIS — J8410 Pulmonary fibrosis, unspecified: Secondary | ICD-10-CM | POA: Diagnosis not present

## 2015-02-14 DIAGNOSIS — Z950 Presence of cardiac pacemaker: Secondary | ICD-10-CM | POA: Diagnosis not present

## 2015-02-14 DIAGNOSIS — Z7982 Long term (current) use of aspirin: Secondary | ICD-10-CM | POA: Diagnosis not present

## 2015-02-14 DIAGNOSIS — R55 Syncope and collapse: Secondary | ICD-10-CM | POA: Diagnosis present

## 2015-02-14 DIAGNOSIS — J849 Interstitial pulmonary disease, unspecified: Secondary | ICD-10-CM | POA: Diagnosis present

## 2015-02-14 DIAGNOSIS — R64 Cachexia: Secondary | ICD-10-CM | POA: Diagnosis present

## 2015-02-14 DIAGNOSIS — E780 Pure hypercholesterolemia: Secondary | ICD-10-CM | POA: Diagnosis present

## 2015-02-14 DIAGNOSIS — R9401 Abnormal electroencephalogram [EEG]: Secondary | ICD-10-CM | POA: Diagnosis not present

## 2015-02-14 DIAGNOSIS — G309 Alzheimer's disease, unspecified: Secondary | ICD-10-CM | POA: Diagnosis present

## 2015-02-14 DIAGNOSIS — F0280 Dementia in other diseases classified elsewhere without behavioral disturbance: Secondary | ICD-10-CM | POA: Diagnosis present

## 2015-02-14 DIAGNOSIS — I214 Non-ST elevation (NSTEMI) myocardial infarction: Secondary | ICD-10-CM | POA: Diagnosis present

## 2015-02-14 DIAGNOSIS — G9340 Encephalopathy, unspecified: Secondary | ICD-10-CM | POA: Diagnosis not present

## 2015-02-14 DIAGNOSIS — M4800 Spinal stenosis, site unspecified: Secondary | ICD-10-CM | POA: Diagnosis present

## 2015-02-14 DIAGNOSIS — G253 Myoclonus: Secondary | ICD-10-CM | POA: Diagnosis not present

## 2015-02-14 DIAGNOSIS — M810 Age-related osteoporosis without current pathological fracture: Secondary | ICD-10-CM | POA: Diagnosis present

## 2015-02-14 DIAGNOSIS — I5032 Chronic diastolic (congestive) heart failure: Secondary | ICD-10-CM | POA: Diagnosis present

## 2015-02-14 DIAGNOSIS — G8929 Other chronic pain: Secondary | ICD-10-CM | POA: Diagnosis present

## 2015-02-14 DIAGNOSIS — J9601 Acute respiratory failure with hypoxia: Secondary | ICD-10-CM | POA: Diagnosis not present

## 2015-02-14 DIAGNOSIS — I469 Cardiac arrest, cause unspecified: Secondary | ICD-10-CM

## 2015-02-14 DIAGNOSIS — D638 Anemia in other chronic diseases classified elsewhere: Secondary | ICD-10-CM | POA: Diagnosis present

## 2015-02-14 DIAGNOSIS — N183 Chronic kidney disease, stage 3 (moderate): Secondary | ICD-10-CM | POA: Diagnosis present

## 2015-02-14 DIAGNOSIS — R262 Difficulty in walking, not elsewhere classified: Secondary | ICD-10-CM | POA: Diagnosis present

## 2015-02-14 DIAGNOSIS — M6281 Muscle weakness (generalized): Secondary | ICD-10-CM | POA: Diagnosis present

## 2015-02-14 DIAGNOSIS — Z66 Do not resuscitate: Secondary | ICD-10-CM | POA: Diagnosis not present

## 2015-02-14 DIAGNOSIS — R042 Hemoptysis: Secondary | ICD-10-CM | POA: Diagnosis present

## 2015-02-14 DIAGNOSIS — R1310 Dysphagia, unspecified: Secondary | ICD-10-CM | POA: Diagnosis not present

## 2015-02-14 DIAGNOSIS — I471 Supraventricular tachycardia: Secondary | ICD-10-CM | POA: Diagnosis present

## 2015-02-14 DIAGNOSIS — Z6827 Body mass index (BMI) 27.0-27.9, adult: Secondary | ICD-10-CM | POA: Diagnosis not present

## 2015-02-14 LAB — TRIGLYCERIDES: Triglycerides: 61 mg/dL (ref <150)

## 2015-02-14 LAB — POCT I-STAT 3, ART BLOOD GAS (G3+)
Acid-base deficit: 9 mmol/L — ABNORMAL HIGH (ref 0.0–2.0)
Bicarbonate: 15.5 mEq/L — ABNORMAL LOW (ref 20.0–24.0)
O2 SAT: 100 %
PCO2 ART: 27.9 mmHg — AB (ref 35.0–45.0)
PO2 ART: 234 mmHg — AB (ref 80.0–100.0)
Patient temperature: 98.5
TCO2: 16 mmol/L (ref 0–100)
pH, Arterial: 7.354 (ref 7.350–7.450)

## 2015-02-14 LAB — URINALYSIS, ROUTINE W REFLEX MICROSCOPIC
Bilirubin Urine: NEGATIVE
Glucose, UA: NEGATIVE mg/dL
Ketones, ur: NEGATIVE mg/dL
Leukocytes, UA: NEGATIVE
Nitrite: NEGATIVE
Protein, ur: >300 mg/dL — AB
SPECIFIC GRAVITY, URINE: 1.021 (ref 1.005–1.030)
UROBILINOGEN UA: 0.2 mg/dL (ref 0.0–1.0)
pH: 6 (ref 5.0–8.0)

## 2015-02-14 LAB — GLUCOSE, CAPILLARY
Glucose-Capillary: 322 mg/dL — ABNORMAL HIGH (ref 70–99)
Glucose-Capillary: 197 mg/dL — ABNORMAL HIGH (ref 70–99)

## 2015-02-14 LAB — URINE MICROSCOPIC-ADD ON

## 2015-02-14 LAB — MRSA PCR SCREENING: MRSA by PCR: NEGATIVE

## 2015-02-14 MED ORDER — MIDAZOLAM HCL 2 MG/2ML IJ SOLN: 1.0000 mg | INTRAMUSCULAR | Status: DC | PRN

## 2015-02-14 MED ORDER — CETYLPYRIDINIUM CHLORIDE 0.05 % MT LIQD: 7.0000 mL | Freq: Four times a day (QID) | OROMUCOSAL | Status: DC

## 2015-02-14 MED ORDER — PANTOPRAZOLE SODIUM 40 MG IV SOLR
40.0000 mg | Freq: Every day | INTRAVENOUS | Status: DC
  Administered 2015-02-14 – 2015-02-15 (×2): 40 mg via INTRAVENOUS
  Filled 2015-02-14 (×3): qty 40

## 2015-02-14 MED ORDER — ASPIRIN 81 MG PO CHEW
81.0000 mg | CHEWABLE_TABLET | Freq: Every day | ORAL | Status: DC
  Administered 2015-02-15: 81 mg
  Filled 2015-02-14: qty 1

## 2015-02-14 MED ORDER — CETYLPYRIDINIUM CHLORIDE 0.05 % MT LIQD
7.0000 mL | Freq: Four times a day (QID) | OROMUCOSAL | Status: DC
  Administered 2015-02-14 – 2015-02-16 (×6): 7 mL via OROMUCOSAL

## 2015-02-14 MED ORDER — CHLORHEXIDINE GLUCONATE 0.12 % MT SOLN
15.0000 mL | Freq: Two times a day (BID) | OROMUCOSAL | Status: DC
  Administered 2015-02-14 – 2015-02-15 (×3): 15 mL via OROMUCOSAL
  Filled 2015-02-14: qty 15

## 2015-02-14 MED ORDER — BACITRACIN ZINC 500 UNIT/GM EX OINT
TOPICAL_OINTMENT | Freq: Two times a day (BID) | CUTANEOUS | Status: DC
  Administered 2015-02-14 – 2015-02-15 (×3): via TOPICAL
  Filled 2015-02-14 (×2): qty 28.35

## 2015-02-14 MED ORDER — SODIUM CHLORIDE 0.9 % IV SOLN
INTRAVENOUS | Status: DC
  Administered 2015-02-15: 100 mL/h via INTRAVENOUS
  Administered 2015-02-15: 03:00:00 via INTRAVENOUS

## 2015-02-14 MED ORDER — HYDROCODONE-ACETAMINOPHEN 5-325 MG PO TABS
1.0000 | ORAL_TABLET | Freq: Once | ORAL | Status: AC
  Administered 2015-02-14: 1 via ORAL
  Filled 2015-02-14: qty 1

## 2015-02-14 MED ORDER — FENTANYL CITRATE 0.05 MG/ML IJ SOLN: 50.0000 ug | INTRAMUSCULAR | Status: DC | PRN

## 2015-02-14 MED ORDER — SODIUM CHLORIDE 0.9 % IV SOLN
25.0000 ug/h | INTRAVENOUS | Status: DC
  Administered 2015-02-15 (×2): 50 ug/h via INTRAVENOUS
  Filled 2015-02-14: qty 50

## 2015-02-14 MED ORDER — FUROSEMIDE 20 MG PO TABS
20.0000 mg | ORAL_TABLET | Freq: Every day | ORAL | Status: DC
  Administered 2015-02-14: 20 mg via ORAL
  Filled 2015-02-14: qty 1

## 2015-02-14 MED ORDER — CHLORHEXIDINE GLUCONATE 0.12 % MT SOLN: 15.0000 mL | Freq: Two times a day (BID) | OROMUCOSAL | Status: DC

## 2015-02-14 MED ORDER — NALOXONE HCL 0.4 MG/ML IJ SOLN
INTRAMUSCULAR | Status: AC
  Filled 2015-02-14: qty 1

## 2015-02-14 MED ORDER — FENTANYL CITRATE 0.05 MG/ML IJ SOLN
50.0000 ug | INTRAMUSCULAR | Status: DC | PRN
  Administered 2015-02-14: 50 ug via INTRAVENOUS
  Filled 2015-02-14: qty 2

## 2015-02-14 MED ORDER — PROPOFOL 10 MG/ML IV EMUL
0.0000 ug/kg/min | INTRAVENOUS | Status: DC
  Administered 2015-02-14: 10 ug/kg/min via INTRAVENOUS
  Administered 2015-02-15: 20 ug/kg/min via INTRAVENOUS
  Filled 2015-02-14: qty 100

## 2015-02-14 NOTE — Progress Notes (Signed)
Report given to receiving RN.

## 2015-02-14 NOTE — Progress Notes (Signed)
Patient not responding Rapid Response and activate code. MD notified and paged.

## 2015-02-14 NOTE — Progress Notes (Signed)
Speech Language Pathology Treatment:    Patient Details Name: Amaris Delafuente MRN: [id number] DOB: 08/12/1928 Today's Date: 02/14/2015 Time: 7425-9563 SLP Time Calculation (min) (ACUTE ONLY): 11 min  Assessment / Plan / Recommendation Clinical Impression  Brief treatment due to pt refusal to take more than a few sips of juice. Would not consume any solids. Discussed basic precautions which pt was able to return demonstrate after a few verbal cues. LIkely pt will frequent reminders for smaller sips to improve swallow, breathing pattern. Discussed with dtr who will reinforce a PACE. No acute f/u needed, will sign off   HPI HPI: Pt is an 81 y/o female with Osteoporesis, rib fx, leg fx, OA, dementia, morbitz II s/p pacemaker, CHF here with coughing for last 2 weeks, with clear sputum with some blood mixed with sputum per ED record. Had a witnessed syncope lasting 1 minute per daughter. Takes pain med for chronic back pain (took 2 vicodin tabs this morning). This has been recently increased due to increased pain related to her OA on both hips, legs. She has been having some balance problems last few days, usually walks to the bathroom on her own but hasn't been able to do that today. Then she had the syncopal event. No n/v but had decreased appetite for 1 day. During our interview, patient said she is hungry. Her other complaint was b/l hip pain going down her legs, has hx of spinal stenosis and takes pain meds. States using walker at home. No fever/chills, diarrhea. Denies any cp or sob. CXR revealed diffuse chronic interstitial changes. There is some suggestion of acute on chronic infiltrate in the right upper lobe.    Pertinent Vitals Pain Assessment: No/denies pain  SLP Plan  All goals met    Recommendations Diet recommendations: Thin liquid;Dysphagia 2 (fine chop) Liquids provided via: Straw;Cup Medication Administration: Whole meds with puree Supervision: Full supervision/cueing for compensatory  strategies Compensations: Small sips/bites;Slow rate Postural Changes and/or Swallow Maneuvers: Seated upright 90 degrees              Oral Care Recommendations: Oral care BID Plan: All goals met    GO     Collan Schoenfeld, Katherene Ponto 02/14/2015, 10:09 AM

## 2015-02-14 NOTE — Progress Notes (Signed)
Pt noted to have myoclonic jerks.  Will add diprivan and versed.  Likely poor prognosis.  Will get EEG in AM.  Coralyn HellingVineet Jaysun Wessels, MD [facility] 02/14/2015, 7:35 PM Pager:  [phone] After 3pm call: [phone]

## 2015-02-14 NOTE — Progress Notes (Signed)
Physical Therapy Treatment Patient Details Name: Mary Hartman MRN: [id number] DOB: 11/05/1928 Today's Date: 02/14/2015    History of Present Illness 81 yo female with onset of syncope and cough with increased weakness, discovered to have chronic pulm fibrosis, malnutrition and possible AKI.    PT Comments    Pt is having a tough day with limited exercise tolerance, declined to get OOB.  Her plan is to go to SNF as she is quite weak, also having a poor exercise tolerance with declining O2 sats during activity.    Follow Up Recommendations  SNF;Supervision/Assistance - 24 hour     Equipment Recommendations  None recommended by PT    Recommendations for Other Services       Precautions / Restrictions Precautions Precautions: Fall;Other (comment) (telemetry) Restrictions Weight Bearing Restrictions: No    Mobility  Bed Mobility Overal bed mobility: Needs Assistance Bed Mobility: Rolling Rolling: Mod assist         General bed mobility comments: Pt declined to get up to bedside  Transfers                    Ambulation/Gait                 Stairs            Wheelchair Mobility    Modified Rankin (Stroke Patients Only)       Balance                                    Cognition Arousal/Alertness: Awake/alert Behavior During Therapy: Anxious Overall Cognitive Status: History of cognitive impairments - at baseline       Memory: Decreased recall of precautions;Decreased short-term memory              Exercises General Exercises - Lower Extremity Ankle Circles/Pumps: AAROM;Both;5 reps Quad Sets: AROM;Both;10 reps Heel Slides: AROM;AAROM;Both;20 reps Hip ABduction/ADduction: AAROM;AROM;Both;20 reps Hip Flexion/Marching: AROM;Both;10 reps    General Comments General comments (skin integrity, edema, etc.): Pt became SOB with the exertion of doing exercises to LE's and could not  fully explain her discomfort on L hip  or her symptoms to PT      Pertinent Vitals/Pain Pain Assessment: Faces Pain Score: 7  Pain Location: L hip Pain Intervention(s): Limited activity within patient's tolerance;Monitored during session;Repositioned    Home Living                      Prior Function            PT Goals (current goals can now be found in the care plan section) Acute Rehab PT Goals Patient Stated Goal: Did not state Progress towards PT goals: PT to reassess next treatment    Frequency  Min 3X/week    PT Plan Current plan remains appropriate    Co-evaluation             End of Session Equipment Utilized During Treatment: Oxygen Activity Tolerance:  (SOB with any exertion) Patient left: in chair;with call bell/phone within reach;with bed alarm set     Time: [id number] PT Time Calculation (min) (ACUTE ONLY): 32 min  Charges:  $Therapeutic Exercise: 8-22 mins $Therapeutic Activity: 8-22 mins                    G Codes:      Ivar DrapeStout, Darelle Kings E 02/14/2015, 3:55 PM  Mee Hives, PT MS Acute Rehab Dept. Number: [phone]

## 2015-02-14 NOTE — Progress Notes (Signed)
UR completed 

## 2015-02-14 NOTE — Consult Note (Signed)
PULMONARY / CRITICAL CARE MEDICINE   Name: Mary Hartman MRN: [id number] DOB: April 12, 1928    ADMISSION DATE:  02/06/2015 CONSULTATION DATE:  02/14/2015  REFERRING MD :  Dr. Dalphine Handing  CHIEF COMPLAINT:  Arrest  INITIAL PRESENTATION: 81 year old female admitted 2/15 with syncope, SOB, and NSTEMI. These symptoms had improved and she was preparing for discharge 2/17 when she suffered a cardiac arrest. Approximately 10 minutes PEA downtime before ROSC. To ICU.  STUDIES:  2/15 CTA chest > Negative for PE, pulmonary fibrosis, increased RUL opacification.   SIGNIFICANT EVENTS: 2/15 Admitted 2/17 Cardiac arrest   HISTORY OF PRESENT ILLNESS:  81 year old female with PMH as below, which includes diastolic CHF, Dementia, end stage pulmonary fibrosis, and AV block mobitz type II. She presented to [facility] ED 2/15 c/o cough x 2 weeks and syncopal episode x 1 on day of admission. Of note her hydrocodone dose had recently been increased. He was hypoxic in ED as well. She was admitted to IMTS with syncope, SOB (thought to be 2nd to IPF), Type 2 NSTEMI, and CKD. She continued to improve until 2/17 when she was preparing for discharge to [place]. She was noted to be unresponsive with agonal respirations. She then suffered a cardiac arrest and under went about 10-15 minutes of ACLS until ROSC was achieved. She was promptly transferred to ICU on ventilator.    PAST MEDICAL HISTORY :   has a past medical history of Osteoporosis; Hypercholesteremia; Orthopedic aftercare for healing traumatic leg fracture; Rib fracture; Osteoarthritis; Spinal stenosis; DEMENTIA; CHF (congestive heart failure); Mobitz II (11-2013); and CAP (community acquired pneumonia) (01/30/2015).  has past surgical history that includes Hernia repair; orthopedic surgery; Pacemaker insertion (12-09-2013); and permanent pacemaker insertion (N/A, 12/09/2013). Prior to Admission medications   Medication Sig Start Date End Date Taking? Authorizing  Provider  acetaminophen (TYLENOL) 325 MG tablet Take 650 mg by mouth at bedtime.    Yes Historical Provider, MD  aspirin 81 MG chewable tablet Chew 81 mg by mouth daily.   Yes Historical Provider, MD  Calcium Carb-Cholecalciferol 600-400 MG-UNIT TABS Take 1 tablet by mouth daily.   Yes Historical Provider, MD  citalopram (CELEXA) 10 MG tablet Take 10 mg by mouth at bedtime.   Yes Historical Provider, MD  donepezil (ARICEPT) 10 MG tablet Take 10 mg by mouth at bedtime.   Yes Historical Provider, MD  furosemide (LASIX) 20 MG tablet Take 1 tablet (20 mg total) by mouth daily. 07/21/13  Yes Ejiroghene E Emokpae, MD  gabapentin (NEURONTIN) 100 MG capsule Take 100 mg by mouth 2 (two) times daily.   Yes Historical Provider, MD  HYDROcodone-acetaminophen (NORCO/VICODIN) 5-325 MG per tablet Take 1 tablet by mouth 3 (three) times daily.   Yes Historical Provider, MD  lidocaine (LIDODERM) 5 % Place 1 patch onto the skin as needed (for pain). AS NEEDED FOR PAIN. Remove & Discard patch within 12 hours or as directed by MD   Yes Historical Provider, MD  menthol-thymol (ABSORBINE JR) LIQD Apply 1 application topically every 8 (eight) hours as needed (for pain).   Yes Historical Provider, MD  Multiple Vitamin (MULTIVITAMIN WITH MINERALS) TABS Take 1 tablet by mouth daily.   Yes Historical Provider, MD  Nutritional Supplements (BOOST BREEZE) LIQD Take 1 Can by mouth daily.   Yes Historical Provider, MD   Allergies  Allergen Reactions  . Lyrica [Pregabalin] Other (See Comments) and Hypertension    BP elevation, combative     FAMILY HISTORY:  has no  family status information on file.  SOCIAL HISTORY:  reports that she has never smoked. She has never used smokeless tobacco. She reports that she does not drink alcohol or use illicit drugs.  REVIEW OF SYSTEMS:  unable  SUBJECTIVE:   VITAL SIGNS: Temp:  [98 F (36.7 C)-99.2 F (37.3 C)] 98.3 F (36.8 C) (02/17 1300) Pulse Rate:  [73-91] 80 (02/17  1300) Resp:  [18] 18 (02/17 1300) BP: (124-142)/(66-75) 124/75 mmHg (02/17 1300) SpO2:  [90 %-96 %] 90 % (02/17 1300) Weight:  [38.7 kg (85 lb 5.1 oz)] 38.7 kg (85 lb 5.1 oz) (02/17 0607) HEMODYNAMICS:   VENTILATOR SETTINGS:   INTAKE / OUTPUT:  Intake/Output Summary (Last 24 hours) at 02/14/15 1824 Last data filed at 02/14/15 1300  Gross per 24 hour  Intake     60 ml  Output      0 ml  Net     60 ml    PHYSICAL EXAMINATION: General:  Cachectic elderly female on vent Neuro:  Obtunded with no sedation HEENT:  [place]/AT, Mild JVD Cardiovascular:  Tachycardic, regular Lungs:  Diffuse rhonchi, no wheeze Abdomen:  Soft, distended Musculoskeletal:  No acute deformity Skin:  Grossly intact  LABS:  CBC  Recent Labs Lab 02-28-2015 1641 02/13/15 0550  WBC 6.3 6.6  HGB 14.5 10.3*  HCT 43.0 31.9*  PLT 158 183   Coag's No results for input(s): APTT, INR in the last 168 hours. BMET  Recent Labs Lab 02-28-15 1641 02/13/15 0411  NA 143 142  K 3.9 3.9  CL 111 113*  CO2 20 17*  BUN 18 18  CREATININE 1.22* 1.15*  GLUCOSE 167* 108*   Electrolytes  Recent Labs Lab 2015/02/28 1641 02/13/15 0411  CALCIUM 8.7 7.9*   Sepsis Markers No results for input(s): LATICACIDVEN, PROCALCITON, O2SATVEN in the last 168 hours. ABG No results for input(s): PHART, PCO2ART, PO2ART in the last 168 hours. Liver Enzymes  Recent Labs Lab 02/28/15 1641  AST 39*  ALT 37*  ALKPHOS 79  BILITOT 0.7  ALBUMIN 3.0*   Cardiac Enzymes  Recent Labs Lab 02-28-15 2158 02/13/15 0411 02/13/15 1000  TROPONINI 0.12* 0.14* 0.10*   Glucose No results for input(s): GLUCAP in the last 168 hours.  Imaging Dg Swallowing Func-speech Pathology  02/13/2015    Objective Swallowing Evaluation:    Patient Details  Name: Mary Hartman MRN: [id number] Date of Birth: 1928-04-21  Today's Date: 02/13/2015 Time: SLP Start Time (ACUTE ONLY): 1308-SLP Stop Time (ACUTE ONLY): 1322 SLP Time Calculation (min) (ACUTE  ONLY): 14 min  Past Medical History:  Past Medical History  Diagnosis Date  . Osteoporosis   . Hypercholesteremia   . Orthopedic aftercare for healing traumatic leg fracture   . Rib fracture   . Osteoarthritis   . Spinal stenosis   . DEMENTIA   . CHF (congestive heart failure)   . Mobitz II 11-2013    s/p pacemaker  . CAP (community acquired pneumonia) 2015-02-28    Hattie Perch 02-28-2015   Past Surgical History:  Past Surgical History  Procedure Laterality Date  . Hernia repair    . Orthopedic surgery    . Pacemaker insertion  12-09-2013    STJ Assurity dual chamber pacemaker placed by Dr Johney Frame  . Permanent pacemaker insertion N/A 12/09/2013    Procedure: PERMANENT PACEMAKER INSERTION;  Surgeon: Gardiner Rhyme, MD;   Location: [facility] CATH LAB;  Service: Cardiovascular;  Laterality: N/A;   HPI:  HPI: Pt is an 81 y/o female  with Osteoporesis, rib fx, leg fx, OA,  dementia, morbitz II s/p pacemaker, CHF here with coughing for last 2  weeks, with clear sputum with some blood mixed with sputum per ED record.  Had a witnessed syncope lasting 1 minute per daughter. Takes pain med for  chronic back pain (took 2 vicodin tabs this morning). This has been  recently increased due to increased pain related to her OA on both hips,  legs. She has been having some balance problems last few days, usually  walks to the bathroom on her own but hasn't been able to do that today.  Then she had the syncopal event. No n/v but had decreased appetite for 1  day. During our interview, patient said she is hungry. Her other complaint  was b/l hip pain going down her legs, has hx of spinal stenosis and takes  pain meds. States using walker at home. No fever/chills, diarrhea. Denies  any cp or sob. CXR revealed diffuse chronic interstitial changes. There is  some suggestion of acute on chronic infiltrate in the right upper lobe.   No Data Recorded  Assessment / Plan / Recommendation CHL IP CLINICAL IMPRESSIONS 02/13/2015  Dysphagia Diagnosis Mild  pharyngeal phase dysphagia  Clinical impression Pt presents with mild pharyngeal phase dysphagia. Pt  has a mild delay in swallow initiation with thin, puree and mechanical  soft textures. Swallow initiated at the level of the vallecula. Prolonged  mastication noted when given mechanical soft textures. Trace amount of  residue noted following PO trails with thin liquids. Pt able to remove  residue following dry swallow. Pt did not aspirate or penetrate when asked  to take large consecutive sips of thin liquids or during PO trials with  purees and solid consistencies. Pooling to the level of the pyriform  sinuses noted during large consecutive sips of thin liquids. Pt is  edentulous, therefore, recommend pt initiate Dys 2/ thin liquid diets, pt  is at mild risk of aspiration given respiratory status. SLP will follow  for diet tolerance.      CHL IP TREATMENT RECOMMENDATION 02/13/2015  Treatment Plan Recommendations Therapy as outlined in treatment plan below      CHL IP DIET RECOMMENDATION 02/13/2015  Diet Recommendations Dysphagia 2 (Fine chop);Thin liquid  Liquid Administration via Straw;Cup  Medication Administration Whole meds with puree  Compensations Small sips/bites;Slow rate  Postural Changes and/or Swallow Maneuvers Seated upright 90 degrees     CHL IP OTHER RECOMMENDATIONS 02/13/2015  Recommended Consults (None)  Oral Care Recommendations Oral care BID  Other Recommendations (None)     CHL IP FOLLOW UP RECOMMENDATIONS 07/19/2013  Follow up Recommendations None     CHL IP FREQUENCY AND DURATION 02/13/2015  Speech Therapy Frequency (ACUTE ONLY) min 2x/week  Treatment Duration 2 weeks     Pertinent Vitals/Pain NA    SLP Swallow Goals No flowsheet data found.  No flowsheet data found.    CHL IP REASON FOR REFERRAL 02/13/2015  Reason for Referral Objectively evaluate swallowing function     CHL IP ORAL PHASE 02/13/2015  Lips (None)  Tongue (None)  Mucous membranes (None)  Nutritional status (None)  Other (None)  Oxygen  therapy (None)  Oral Phase WFL  Oral - Pudding Teaspoon (None)  Oral - Pudding Cup (None)  Oral - Honey Teaspoon (None)  Oral - Honey Cup (None)  Oral - Honey Syringe (None)  Oral - Nectar Teaspoon (None)  Oral - Nectar Cup (None)  Oral - Nectar Straw (None)  Oral - Nectar Syringe (None)  Oral - Ice Chips (None)  Oral - Thin Teaspoon (None)  Oral - Thin Cup (None)  Oral - Thin Straw (None)  Oral - Thin Syringe (None)  Oral - Puree (None)  Oral - Mechanical Soft (None)  Oral - Regular (None)  Oral - Multi-consistency (None)  Oral - Pill (None)  Oral Phase - Comment (None)      CHL IP PHARYNGEAL PHASE 02/13/2015  Pharyngeal Phase Impaired  Pharyngeal - Pudding Teaspoon (None)  Penetration/Aspiration details (pudding teaspoon) (None)  Pharyngeal - Pudding Cup (None)  Penetration/Aspiration details (pudding cup) (None)  Pharyngeal - Honey Teaspoon (None)  Penetration/Aspiration details (honey teaspoon) (None)  Pharyngeal - Honey Cup (None)  Penetration/Aspiration details (honey cup) (None)  Pharyngeal - Honey Syringe (None)  Penetration/Aspiration details (honey syringe) (None)  Pharyngeal - Nectar Teaspoon (None)  Penetration/Aspiration details (nectar teaspoon) (None)  Pharyngeal - Nectar Cup (None)  Penetration/Aspiration details (nectar cup) (None)  Pharyngeal - Nectar Straw (None)  Penetration/Aspiration details (nectar straw) (None)  Pharyngeal - Nectar Syringe (None)  Penetration/Aspiration details (nectar syringe) (None)  Pharyngeal - Ice Chips (None)  Penetration/Aspiration details (ice chips) (None)  Pharyngeal - Thin Teaspoon (None)  Penetration/Aspiration details (thin teaspoon) (None)  Pharyngeal - Thin Cup Premature spillage to valleculae;Pharyngeal residue  - valleculae;Pharyngeal residue - pyriform sinuses  Penetration/Aspiration details (thin cup) (None)  Pharyngeal - Thin Straw Premature spillage to valleculae;Pharyngeal  residue - valleculae;Pharyngeal residue - pyriform sinuses  Penetration/Aspiration  details (thin straw) (None)  Pharyngeal - Thin Syringe (None)  Penetration/Aspiration details (thin syringe') (None)  Pharyngeal - Puree Delayed swallow initiation  Penetration/Aspiration details (puree) (None)  Pharyngeal - Mechanical Soft Premature spillage to valleculae  Penetration/Aspiration details (mechanical soft) (None)  Pharyngeal - Regular (None)  Penetration/Aspiration details (regular) (None)  Pharyngeal - Multi-consistency (None)  Penetration/Aspiration details (multi-consistency) (None)  Pharyngeal - Pill WFL  Penetration/Aspiration details (pill) (None)  Pharyngeal Comment (None)     CHL IP CERVICAL ESOPHAGEAL PHASE 02/13/2015  Cervical Esophageal Phase WFL  Pudding Teaspoon (None)  Pudding Cup (None)  Honey Teaspoon (None)  Honey Cup (None)  Honey Syringe (None)  Nectar Teaspoon (None)  Nectar Cup (None)  Nectar Straw (None)  Nectar Syringe (None)  Thin Teaspoon (None)  Thin Cup (None)  Thin Straw (None)  Thin Syringe (None)  Cervical Esophageal Comment (None)    CHL IP GO 02/13/2015  Functional Assessment Tool Used (No Data)  Functional Limitations Swallowing  Swallow Current Status (Z6109(G8996) CI  Swallow Goal Status (U0454(G8997) CI  Swallow Discharge Status (U9811(G8998) (None)  Motor Speech Current Status (B1478(G8999) (None)  Motor Speech Goal Status (G9562(G9186) (None)  Motor Speech Goal Status (Z3086(G9158) (None)  Spoken Language Comprehension Current Status (V7846(G9159) (None)  Spoken Language Comprehension Goal Status (N6295(G9160) (None)  Spoken Language Comprehension Discharge Status (M8413(G9161) (None)  Spoken Language Expression Current Status (K4401(G9162) (None)  Spoken Language Expression Goal Status (U2725(G9163) (None)  Spoken Language Expression Discharge Status ([phone](G9164) (None)  Attention Current Status (I3474(G9165) (None)  Attention Goal Status (Q5956(G9166) (None)  Attention Discharge Status (L8756(G9167) (None)  Memory Current Status (E3329(G9168) (None)  Memory Goal Status (J1884(G9169) (None)  Memory Discharge Status (Z6606(G9170) (None)  Voice Current Status (T0160(G9171)  (None)  Voice Goal Status (F0932(G9172) (None)  Voice Discharge Status (T5573(G9173) (None)  Other Speech-Language Pathology Functional Limitation (U2025(G9174) (None)  Other Speech-Language Pathology Functional Limitation Goal Status (K2706(G9175)  (None)  Other Speech-Language Pathology Functional Limitation Discharge Status  [phone](G9176) (None)  Harlon Ditty, MA CCC-SLP [phone]  DeBlois, Riley Nearing 02/13/2015, 1:47 PM      ASSESSMENT / PLAN:  PULMONARY OETT 2/17 > A: Acute hypoxic respiratory failure s/p arrest. Recently dx pulmonary fibrosis >> CT appearance looks like this has been present for some time. P:   Full vent support Follow CXR, ABG VAP bundle  CARDIOVASCULAR A:  Cardiac arrest. Chronic diastolic CHF (LVEF 65-70%). H/o Mobitz II block. P:  MAP goal > 70mm/Hg Gentle IVF resusctitiation Trend troponin Check lactic acid Echo No additional CPR No escalation  RENAL A:   CKD stage 2. P:   Follow Bmet  GASTROINTESTINAL A:   Severe protein calorie malnutrition. Dysphagia. P:   NPO SUP: IV protonix  HEMATOLOGIC A:   Anemia of chronic disease. P:  Follow Bmet  INFECTIOUS A:   No acute issues. P:   Trend WBC and fever curve  ENDOCRINE A:   No acute issues. P:   CBG monitoring  NEUROLOGIC A:   Acute encephalopathy s/p cardiac arrest. Hx of dementia >> family reports she was independent with most ADLs prior to admission. Chronic pain. P:   RASS goal: 0 Monitor PRN fent if sedation needed Hold home sedating meds  Joneen Roach, AGACNP-BC [place] [facility] Pager [phone] or [phone]  Reviewed above, and examined.  81 yo female admitted with syncope.  There was concern about excess narcotic use.  There was also concern for PNA, but CT chest was more consistent with pulmonary fibrosis.  She was ready to be transferred to nursing home when she was noted to be unresponsive.  She was not breathing and did not have a pulse.  CPR was  started, and she was intubated.  She had ROSC after about 10 minutes.  She was transferred to ICU.  I spoke with her daughter (POA).  She would like to continue current tx including mechanical ventilation.  She would not want CPR again if she develops cardiac arrest, and would not want escalation of care if her medical status gets worse.  CC time by me independent of APP time and procedure time is 40 minutes.  Coralyn Helling, MD [facility] Pulmonary/Critical Care 02/14/2015, 7:01 PM Pager:  [phone] After 3pm call: [phone]

## 2015-02-14 NOTE — Progress Notes (Signed)
Subjective: Ms. Dolney would like to go home! When asked how she is breathing, she repeatedly states "I hope well". She is in no pain.  Objective: Vital signs in last 24 hours: Filed Vitals:   02/13/15 1511 02/13/15 2038 02/14/15 0204 02/14/15 0607  BP: 173/81 133/72 136/74 142/71  Pulse: 73 89 73 91  Temp: 97.9 F (36.6 C) 99.2 F (37.3 C) 98 F (36.7 C) 98.3 F (36.8 C)  TempSrc: Oral Oral Oral Oral  Resp: Height:      Weight:    85 lb 5.1 oz (38.7 kg)  SpO2: 90% 95% 96% 95%   Weight change: -4 lb 3 oz (-1.9 kg)  Intake/Output Summary (Last 24 hours) at 02/14/15 0746 Last data filed at 02/14/15 1610  Gross per 24 hour  Intake    330 ml  Output      0 ml  Net    330 ml   Physical Exam: Appearance: in NAD, frail-appearing, eating breakfast, Uriah currently not in use HEENT: [place]/AT, slightly dry MM, PERRL, EOMi, normal range of motion in neck, no JVD Heart: RRR, normal S1S2, no MRG Lungs: increased work of breathing, CTAB, no wheezes, crackles at both bases Abdomen: BS+, soft, nontender, nondistended Musculoskeletal: tenderness at hips to palpation, MCP contracture L>R, no edema BLE Neurologic: oriented only to self, otherwise grossly intact Skin: warm and dry, no rashes or lesions  Lab Results: Basic Metabolic Panel:  Recent Labs Lab 01/31/2015 1641 02/13/15 0411  NA 143 142  K 3.9 3.9  CL 111 113*  CO2 20 17*  GLUCOSE 167* 108*  BUN 18 18  CREATININE 1.22* 1.15*  CALCIUM 8.7 7.9*   Liver Function Tests:  Recent Labs Lab 02/11/2015 1641  AST 39*  ALT 37*  ALKPHOS 79  BILITOT 0.7  PROT 6.6  ALBUMIN 3.0*   CBC:  Recent Labs Lab 02/10/2015 1641 02/13/15 0550  WBC 6.3 6.6  NEUTROABS 2.0  --   HGB 14.5 10.3*  HCT 43.0 31.9*  MCV 82.2 88.4  PLT 158 183   Cardiac Enzymes:  Recent Labs Lab 01/29/2015 2158 02/13/15 0411 02/13/15 1000  TROPONINI 0.12* 0.14* 0.10*   D-Dimer:  Recent Labs Lab 02/09/2015 1641  DDIMER 1.47*    Urinalysis:  Recent Labs Lab 02/23/2015 1719  COLORURINE YELLOW  LABSPEC 1.022  PHURINE 5.5  GLUCOSEU NEGATIVE  HGBUR NEGATIVE  BILIRUBINUR NEGATIVE  KETONESUR NEGATIVE  PROTEINUR NEGATIVE  UROBILINOGEN 0.2  NITRITE NEGATIVE  LEUKOCYTESUR NEGATIVE   Studies/Results: Dg Chest 2 View  02/21/2015   CLINICAL DATA:  Cough and wheezing for 2 days  EXAM: CHEST  2 VIEW  COMPARISON:  03/20/2014  FINDINGS: Cardiac shadow is mildly prominent. A pacing device is again seen and stable. Diffuse chronic interstitial changes are noted throughout both lungs. Some slight increased density in the right upper lobe may represent progression of the chronic changes or possibly acute on chronic infiltrate. No sizable effusion is seen.  IMPRESSION: Diffuse chronic interstitial changes. There is some suggestion of acute on chronic infiltrate in the right upper lobe.   Electronically Signed   By: Alcide Clever M.D.   On: 02/15/2015 17:01   Ct Angio Chest Pe W/cm &/or Wo Cm  02/08/2015   CLINICAL DATA:  Elevated D-dimer.  Near syncope.  EXAM: CT ANGIOGRAPHY CHEST WITH CONTRAST  TECHNIQUE: Multidetector CT imaging of the chest was performed using the standard protocol during bolus administration of intravenous contrast. Multiplanar CT image  reconstructions and MIPs were obtained to evaluate the vascular anatomy.  CONTRAST:  70 mL OMNIPAQUE IOHEXOL 350 MG/ML SOLN  COMPARISON:  PA and lateral chest earlier this same day, 12/10/2013 and 03/20/2014  FINDINGS: No pulmonary embolus is identified. Marked cardiomegaly is seen. No pleural or pericardial effusion. Calcific aortic and coronary atherosclerosis is noted. Pacing device is noted. No axillary, hilar or mediastinal lymphadenopathy.  Extensive pulmonary fibrotic change is identified with some honeycombing seen in the lung bases and coarsening of the pulmonary interstitium throughout. There is a somewhat more focal airspace disease and ground glass attenuation in the right  upper lobe. No pneumothorax is noted.  Visualized upper abdomen demonstrates bowel loops outside the abdominal cavity to the left of midline posteriorly. No lytic or sclerotic bony lesion is identified.  Review of the MIP images confirms the above findings.  IMPRESSION: Negative for pulmonary embolus.  Pulmonary fibrosis with more focal ground-glass attenuation in the right upper lobe which could represent active inflammation or infection.  Marked cardiomegaly.  Partial visualization of extra-abdominal bowel to the left of midline posteriorly likely due to a lumbar hernia which is not imaged on study.   Electronically Signed   By: Drusilla Kanner M.D.   On: 02/01/2015 19:50   Dg Swallowing Func-speech Pathology  02/13/2015    Objective Swallowing Evaluation:    Patient Details  Name: Shandy Checo MRN: [id number] Date of Birth: [birth year]  Today's Date: 02/13/2015 Time: SLP Start Time (ACUTE ONLY): 1308-SLP Stop Time (ACUTE ONLY): 1322 SLP Time Calculation (min) (ACUTE ONLY): 14 min  Past Medical History:  Past Medical History  Diagnosis Date  . Osteoporosis   . Hypercholesteremia   . Orthopedic aftercare for healing traumatic leg fracture   . Rib fracture   . Osteoarthritis   . Spinal stenosis   . DEMENTIA   . CHF (congestive heart failure)   . Mobitz II 11-2013    s/p pacemaker  . CAP (community acquired pneumonia) 02/08/2015    Hattie Perch 02/19/2015   Past Surgical History:  Past Surgical History  Procedure Laterality Date  . Hernia repair    . Orthopedic surgery    . Pacemaker insertion  12-09-2013    STJ Assurity dual chamber pacemaker placed by Dr Johney Frame  . Permanent pacemaker insertion N/A 12/09/2013    Procedure: PERMANENT PACEMAKER INSERTION;  Surgeon: Gardiner Rhyme, MD;   Location: [facility] CATH [facility];  Service: Cardiovascular;  Laterality: N/A;   HPI:  HPI: Pt is an 81 y/o female with Osteoporesis, rib fx, leg fx, OA,  dementia, morbitz II s/p pacemaker, CHF here with coughing for last 2  weeks, with clear sputum  with some blood mixed with sputum per ED record.  Had a witnessed syncope lasting 1 minute per daughter. Takes pain med for  chronic back pain (took 2 vicodin tabs this morning). This has been  recently increased due to increased pain related to her OA on both hips,  legs. She has been having some balance problems last few days, usually  walks to the bathroom on her own but hasn't been able to do that today.  Then she had the syncopal event. No n/v but had decreased appetite for 1  day. During our interview, patient said she is hungry. Her other complaint  was b/l hip pain going down her legs, has hx of spinal stenosis and takes  pain meds. States using walker at home. No fever/chills, diarrhea. Denies  any cp or sob. CXR revealed diffuse chronic  interstitial changes. There is  some suggestion of acute on chronic infiltrate in the right upper lobe.   No Data Recorded  Assessment / Plan / Recommendation CHL IP CLINICAL IMPRESSIONS 02/13/2015  Dysphagia Diagnosis Mild pharyngeal phase dysphagia  Clinical impression Pt presents with mild pharyngeal phase dysphagia. Pt  has a mild delay in swallow initiation with thin, puree and mechanical  soft textures. Swallow initiated at the level of the vallecula. Prolonged  mastication noted when given mechanical soft textures. Trace amount of  residue noted following PO trails with thin liquids. Pt able to remove  residue following dry swallow. Pt did not aspirate or penetrate when asked  to take large consecutive sips of thin liquids or during PO trials with  purees and solid consistencies. Pooling to the level of the pyriform  sinuses noted during large consecutive sips of thin liquids. Pt is  edentulous, therefore, recommend pt initiate Dys 2/ thin liquid diets, pt  is at mild risk of aspiration given respiratory status. SLP will follow  for diet tolerance.      CHL IP TREATMENT RECOMMENDATION 02/13/2015  Treatment Plan Recommendations Therapy as outlined in treatment plan  below      CHL IP DIET RECOMMENDATION 02/13/2015  Diet Recommendations Dysphagia 2 (Fine chop);Thin liquid  Liquid Administration via Straw;Cup  Medication Administration Whole meds with puree  Compensations Small sips/bites;Slow rate  Postural Changes and/or Swallow Maneuvers Seated upright 90 degrees     CHL IP OTHER RECOMMENDATIONS 02/13/2015  Recommended Consults (None)  Oral Care Recommendations Oral care BID  Other Recommendations (None)     CHL IP FOLLOW UP RECOMMENDATIONS 07/19/2013  Follow up Recommendations None     CHL IP FREQUENCY AND DURATION 02/13/2015  Speech Therapy Frequency (ACUTE ONLY) min 2x/week  Treatment Duration 2 weeks     Pertinent Vitals/Pain NA    SLP Swallow Goals No flowsheet data found.  No flowsheet data found.    CHL IP REASON FOR REFERRAL 02/13/2015  Reason for Referral Objectively evaluate swallowing function     CHL IP ORAL PHASE 02/13/2015  Lips (None)  Tongue (None)  Mucous membranes (None)  Nutritional status (None)  Other (None)  Oxygen therapy (None)  Oral Phase WFL  Oral - Pudding Teaspoon (None)  Oral - Pudding Cup (None)  Oral - Honey Teaspoon (None)  Oral - Honey Cup (None)  Oral - Honey Syringe (None)  Oral - Nectar Teaspoon (None)  Oral - Nectar Cup (None)  Oral - Nectar Straw (None)  Oral - Nectar Syringe (None)  Oral - Ice Chips (None)  Oral - Thin Teaspoon (None)  Oral - Thin Cup (None)  Oral - Thin Straw (None)  Oral - Thin Syringe (None)  Oral - Puree (None)  Oral - Mechanical Soft (None)  Oral - Regular (None)  Oral - Multi-consistency (None)  Oral - Pill (None)  Oral Phase - Comment (None)      CHL IP PHARYNGEAL PHASE 02/13/2015  Pharyngeal Phase Impaired  Pharyngeal - Pudding Teaspoon (None)  Penetration/Aspiration details (pudding teaspoon) (None)  Pharyngeal - Pudding Cup (None)  Penetration/Aspiration details (pudding cup) (None)  Pharyngeal - Honey Teaspoon (None)  Penetration/Aspiration details (honey teaspoon) (None)  Pharyngeal - Honey Cup (None)   Penetration/Aspiration details (honey cup) (None)  Pharyngeal - Honey Syringe (None)  Penetration/Aspiration details (honey syringe) (None)  Pharyngeal - Nectar Teaspoon (None)  Penetration/Aspiration details (nectar teaspoon) (None)  Pharyngeal - Nectar Cup (None)  Penetration/Aspiration details (nectar cup) (None)  Pharyngeal -  Nectar Straw (None)  Penetration/Aspiration details (nectar straw) (None)  Pharyngeal - Nectar Syringe (None)  Penetration/Aspiration details (nectar syringe) (None)  Pharyngeal - Ice Chips (None)  Penetration/Aspiration details (ice chips) (None)  Pharyngeal - Thin Teaspoon (None)  Penetration/Aspiration details (thin teaspoon) (None)  Pharyngeal - Thin Cup Premature spillage to valleculae;Pharyngeal residue  - valleculae;Pharyngeal residue - pyriform sinuses  Penetration/Aspiration details (thin cup) (None)  Pharyngeal - Thin Straw Premature spillage to valleculae;Pharyngeal  residue - valleculae;Pharyngeal residue - pyriform sinuses  Penetration/Aspiration details (thin straw) (None)  Pharyngeal - Thin Syringe (None)  Penetration/Aspiration details (thin syringe') (None)  Pharyngeal - Puree Delayed swallow initiation  Penetration/Aspiration details (puree) (None)  Pharyngeal - Mechanical Soft Premature spillage to valleculae  Penetration/Aspiration details (mechanical soft) (None)  Pharyngeal - Regular (None)  Penetration/Aspiration details (regular) (None)  Pharyngeal - Multi-consistency (None)  Penetration/Aspiration details (multi-consistency) (None)  Pharyngeal - Pill WFL  Penetration/Aspiration details (pill) (None)  Pharyngeal Comment (None)     CHL IP CERVICAL ESOPHAGEAL PHASE 02/13/2015  Cervical Esophageal Phase WFL  Pudding Teaspoon (None)  Pudding Cup (None)  Honey Teaspoon (None)  Honey Cup (None)  Honey Syringe (None)  Nectar Teaspoon (None)  Nectar Cup (None)  Nectar Straw (None)  Nectar Syringe (None)  Thin Teaspoon (None)  Thin Cup (None)  Thin Straw (None)  Thin Syringe  (None)  Cervical Esophageal Comment (None)    CHL IP GO 02/13/2015  Functional Assessment Tool Used (No Data)  Functional Limitations Swallowing  Swallow Current Status (Z6109) CI  Swallow Goal Status (U0454) CI  Swallow Discharge Status (U9811) (None)  Motor Speech Current Status (B1478) (None)  Motor Speech Goal Status (G9562) (None)  Motor Speech Goal Status (Z3086) (None)  Spoken Language Comprehension Current Status (V7846) (None)  Spoken Language Comprehension Goal Status (N6295) (None)  Spoken Language Comprehension Discharge Status (M8413) (None)  Spoken Language Expression Current Status (K4401) (None)  Spoken Language Expression Goal Status (U2725) (None)  Spoken Language Expression Discharge Status [phone]) (None)  Attention Current Status (I3474) (None)  Attention Goal Status (Q5956) (None)  Attention Discharge Status (L8756) (None)  Memory Current Status (E3329) (None)  Memory Goal Status (J1884) (None)  Memory Discharge Status (Z6606) (None)  Voice Current Status (T0160) (None)  Voice Goal Status (F0932) (None)  Voice Discharge Status (T5573) (None)  Other Speech-Language Pathology Functional Limitation (U2025) (None)  Other Speech-Language Pathology Functional Limitation Goal Status (K2706)  (None)  Other Speech-Language Pathology Functional Limitation Discharge Status  [phone]) (None)          Harlon Ditty, MA CCC-SLP [phone]  [place], Riley Nearing 02/13/2015, 1:47 PM    Medications: I have reviewed the patient's current medications. Scheduled Meds: . acetaminophen  650 mg Oral QHS  . aspirin  81 mg Oral Daily  . citalopram  10 mg Oral QHS  . donepezil  10 mg Oral QHS  . gabapentin  100 mg Oral BID  . heparin  5,000 Units Subcutaneous 3 times per day   Continuous Infusions:  PRN Meds:.lidocaine, MUSCLE RUB, ondansetron **OR** ondansetron (ZOFRAN) IV Assessment/Plan: Principal Problem:   Syncope Active Problems:   Gait disorder   Cough   Alzheimer's disease   Osteoarthritis    Muscle weakness (generalized)   Protein-calorie malnutrition, severe   Chronic diastolic CHF (congestive heart failure)   Sciatic pain   CKD (chronic kidney disease) stage 3, GFR 30-59 ml/min   Pulmonary fibrosis  Ms. Cortese is an 81 year old woman with diastolic CHF, dementia and a gait  imbalance who presented with syncope and shortness of breath. Her CTA chest revealed previously diagnosed pulmonary fibrosis, which may explain her syncopal episode and shortness of breath. Patient's daughter works full time and is not comfortable bringing her home with her current functional ability; they are interested in short-term SNF.  Syncopal Episode: Lasted less than 1 minute. No recurrence. Likely precipitated by her underlying pulmonary fibrosis (first noted in 2009, family not aware of diagnosis). Dementia and increased vicodin may also have contributed. Daughter stated patient has been feeling more drowsy and had worsening of her balance with the increased vicodin dose. Her PO intake had also been low the day prior to admission. Pacemaker (placed in 2014 for mobitz II) was interrogated and showed no recent events. Had 6 second episode of atrial tachycardia at 160 each per min on 01/09/2015. Patient was given a small amount of fluid. Echo revealed LVEF 65-70% with grade 1 diastolic dysfunction. After discussion with PCP, clear that patient has not needed supplemental O2 over the past 3 months. Also clear that she has difficult O2 saturation monitoring due to poor circulation. - Telemetry - Appreciate PT evaluation and treatment; recommending SNF - Need goals of care discussion  Elevated Troponin: Cardiology overnight directed repeat echo. Also advised heparin drip and cardiology consult IF patient's troponins elevated to 0.4. Patient's troponins trended down: 0.12-->0.14-->0.10. Patient denies any chest pain. Patient was given home aspirin 325 mg.  Idiopathic Chronic Severe Interstitial Lung Disease:  Patient complains of cough productive of mild clear sputum and shortness of breath. May have contributed to syncopal episode. Had transient low O2 noted in ED, but recovered on room air. Venous ABG showed low PO2. Pulmonary fibrosis evident on CXR and CTA chest, first seen on imaging in 2009, formal diagnosis 2014. Follows with Dr. Lajoyce Cornersuda (orthopaedics) for cortisone injections. May have seronegative RA (MCP deformities on exam, contracture). Afebrile with no white count. [facility]; patient is well-managed without oxygen at home. Has had O2 saturations in the high 90s on all weekly visits over the past 2 months. He believes that this admission had a component of over-reaction by the patient's very anxious daughter; he also believes that further workup of ILD is not necessary, as it would not change plans. - Placed back on 1 L O2  during exam today; will attempt to wean later - CXR pending (given increased work of breathing off of O2)  CKD Stage III: At baseline (1.22->1.15) after cautious fluid resuscitation. Dry on initial exam. Normal UA. On lasix 20 mg by mouth daily at home. - Resume home lasix 20 mg daily  Chronic Diastolic CHF: Prior echo 06/2013, 65-70% EF moderate concentric hypertrophy, mild pulm HTN. Repeat echo showed LVEF 65-70% with grade 1 diastolic dysfunction. - Resumed home lasix 20 mg daily - Continue home aspirin 325 mg daily  Mobitz Type II Second Degree AV Block: Has pacemaker (interrogated in ED).  Dementia: Continue home aricept + celexa.  Spinal Stenosis and Sciatic Pain: Recently increased vicodin dose is 5-325 mg by mouth TID. This may be contributing to syncope, worsening mental status; may also be causing respiratory inhibition. Per PCP, patient should not be taking extra doses of vicodin at discharge. - Holding vicodin for now - Lidoderm patch daily (home dose) - Neurontin 100 mg BID (home dose)  Severe Protein Malnutrition: Has temporal wasting on  exam, albumin 3.0. - Nutrition consult - Continue boost breeze  Osteoporosis: Had leg fracture 11/2014.  - Continue calcium  Code: wants CPR but no ventilator. Pressors are ok. Discussed with daughter who is next to kin  Diet: Dysphagia 2 diet  Dispo: Disposition is deferred at this time, awaiting improvement of current medical problems.  Anticipated discharge in approximately 0-1 day(s).   The patient does have a current PCP Jethro Bastos, MD) and does not need an [facility] hospital follow-up appointment after discharge.  The patient does have transportation limitations that hinder transportation to clinic appointments.  .Services Needed at time of discharge: Y = Yes, Blank = No PT:   OT:   RN:   Equipment:   Other:       Dionne Ano, MD 02/14/2015, 7:46 AM

## 2015-02-14 NOTE — Discharge Summary (Signed)
Name: Mary Hartman MRN: [id number] DOB: [birth year] 81 y.o. PCP: Jethro Bastos, MD  Date of Admission: 02/05/2015  3:15 PM Date of Discharge: 02/14/2015 Attending Physician: Aletta Edouard, MD  Discharge Diagnosis: Principal Problem:  Syncopal episode Other Chronic Problems: Chronic Interstitial Lung Disease OA Alzheimer's Disease Gait Disorder Malnutrition CHF CKD III Sciatic Pain  Discharge Medications:   Medication List    ASK your doctor about these medications        acetaminophen 325 MG tablet  Commonly known as:  TYLENOL  Take 650 mg by mouth at bedtime.     aspirin 81 MG chewable tablet  Chew 81 mg by mouth daily.     BOOST BREEZE Liqd  Take 1 Can by mouth daily.     Calcium Carb-Cholecalciferol 600-400 MG-UNIT Tabs  Take 1 tablet by mouth daily.     citalopram 10 MG tablet  Commonly known as:  CELEXA  Take 10 mg by mouth at bedtime.     donepezil 10 MG tablet  Commonly known as:  ARICEPT  Take 10 mg by mouth at bedtime.     furosemide 20 MG tablet  Commonly known as:  LASIX  Take 1 tablet (20 mg total) by mouth daily.     gabapentin 100 MG capsule  Commonly known as:  NEURONTIN  Take 100 mg by mouth 2 (two) times daily.     HYDROcodone-acetaminophen 5-325 MG per tablet  Commonly known as:  NORCO/VICODIN  Take 1 tablet by mouth 3 (three) times daily.     lidocaine 5 %  Commonly known as:  LIDODERM  Place 1 patch onto the skin as needed (for pain). AS NEEDED FOR PAIN. Remove & Discard patch within 12 hours or as directed by MD     menthol-thymol Liqd  Apply 1 application topically every 8 (eight) hours as needed (for pain).     multivitamin with minerals Tabs tablet  Take 1 tablet by mouth daily.        Disposition and follow-up:   [name] was discharged from [facility] in Stable condition.  At the hospital follow up visit please address:  1.  Syncopal Episode: Patient was admitted with report of  syncope, but there was no recurrence. May have been prompted by ILD or by her extra dose of Vicodin. Patient advised to only take prescribed amount of vicodin.  Chronic Interstitial Lung Disease: Very difficult to monitor O2 saturation in this patient, but she had increased work of breathing with no signs of pneumonia (on serial CXR, afebrile, no white count). Patient was requesting 1 L O2 by [place] while in the hospital, but will likely be able to be weaned at [facility].  Chronic Diastolic CHF: Repeat echo showed LVEF 65-70% with grade 1 diastolic dysfunction. Held 2 doses of home lasix because patient was dry on admission, then resumed.  Mobitz Type II Second Degree AV Block: Has pacemaker (interrogated in ED to show no recent events. Had 6 second episode of atrial tachycardia at 160 each per min on 01/09/2015).  Hip Pain: Resume vicodin as outpatient.  2.  Labs / imaging needed at time of follow-up: BMP in 2 days (01/16/15) as patient resumed lasix  3.  Pending labs/ test needing follow-up: none  Follow-up Appointments: Dr. Dorothe Pea at [facility] as soon as patient is discharged from SNF.  Discharge Instructions: Please do not take any extra vicodin (only amount prescribed).   Continue to monitor your oxygen needs by your work  of breathing, rather than by your O2 saturation (your poor circulation makes the finger O2 saturation monitor very difficult to interpret). For most of this hospitalization, you were saturating well on room air. Doubt very much the necessity of supplemental oxygen once you leave [facility].  There was no evidence of pneumonia or infection on either of your chest xrays.  Consultations:  speech therapy, cardiology (fellow)  Procedures Performed:  Dg Chest 2 View  02/14/2015   CLINICAL DATA:  Shortness of breath for 6 months  EXAM: CHEST  2 VIEW  COMPARISON:  03/09/2015  FINDINGS: There is a left chest wall pacer device with leads in the right atrial appendage and right ventricle. The  heart size appears mildly enlarged. Diffuse coarsened interstitial markings are identified bilaterally compatible with pulmonary fibrosis. No superimposed airspace consolidation identified. No plural effusion identified.  IMPRESSION: 1. Advanced changes of pulmonary fibrosis. 2. Cardiac enlargement.   Electronically Signed   By: Signa Kell M.D.   On: 02/14/2015 14:27   Dg Chest 2 View  03-09-2015   CLINICAL DATA:  Cough and wheezing for 2 days  EXAM: CHEST  2 VIEW  COMPARISON:  03/20/2014  FINDINGS: Cardiac shadow is mildly prominent. A pacing device is again seen and stable. Diffuse chronic interstitial changes are noted throughout both lungs. Some slight increased density in the right upper lobe may represent progression of the chronic changes or possibly acute on chronic infiltrate. No sizable effusion is seen.  IMPRESSION: Diffuse chronic interstitial changes. There is some suggestion of acute on chronic infiltrate in the right upper lobe.   Electronically Signed   By: Alcide Clever M.D.   On: Mar 09, 2015 17:01   Ct Angio Chest Pe W/cm &/or Wo Cm  03/09/15   CLINICAL DATA:  Elevated D-dimer.  Near syncope.  EXAM: CT ANGIOGRAPHY CHEST WITH CONTRAST  TECHNIQUE: Multidetector CT imaging of the chest was performed using the standard protocol during bolus administration of intravenous contrast. Multiplanar CT image reconstructions and MIPs were obtained to evaluate the vascular anatomy.  CONTRAST:  70 mL OMNIPAQUE IOHEXOL 350 MG/ML SOLN  COMPARISON:  PA and lateral chest earlier this same day, 12/10/2013 and 03/20/2014  FINDINGS: No pulmonary embolus is identified. Marked cardiomegaly is seen. No pleural or pericardial effusion. Calcific aortic and coronary atherosclerosis is noted. Pacing device is noted. No axillary, hilar or mediastinal lymphadenopathy.  Extensive pulmonary fibrotic change is identified with some honeycombing seen in the lung bases and coarsening of the pulmonary interstitium throughout.  There is a somewhat more focal airspace disease and ground glass attenuation in the right upper lobe. No pneumothorax is noted.  Visualized upper abdomen demonstrates bowel loops outside the abdominal cavity to the left of midline posteriorly. No lytic or sclerotic bony lesion is identified.  Review of the MIP images confirms the above findings.  IMPRESSION: Negative for pulmonary embolus.  Pulmonary fibrosis with more focal ground-glass attenuation in the right upper lobe which could represent active inflammation or infection.  Marked cardiomegaly.  Partial visualization of extra-abdominal bowel to the left of midline posteriorly likely due to a lumbar hernia which is not imaged on study.   Electronically Signed   By: Drusilla Kanner M.D.   On: 03-09-15 19:50   Dg Swallowing Func-speech Pathology  02/13/2015    Objective Swallowing Evaluation:    Patient Details  Name: Mary Hartman MRN: [id number] Date of Birth: [birth year]  Today's Date: 02/13/2015 Time: SLP Start Time (ACUTE ONLY): 1308-SLP Stop Time (ACUTE ONLY): 1322  SLP Time Calculation (min) (ACUTE ONLY): 14 min  Past Medical History:  Past Medical History  Diagnosis Date  . Osteoporosis   . Hypercholesteremia   . Orthopedic aftercare for healing traumatic leg fracture   . Rib fracture   . Osteoarthritis   . Spinal stenosis   . DEMENTIA   . CHF (congestive heart failure)   . Mobitz II 11-2013    s/p pacemaker  . CAP (community acquired pneumonia) 02/24/2015    Hattie Perch 02/24/2015   Past Surgical History:  Past Surgical History  Procedure Laterality Date  . Hernia repair    . Orthopedic surgery    . Pacemaker insertion  12-09-2013    STJ Assurity dual chamber pacemaker placed by Dr Johney Frame  . Permanent pacemaker insertion N/A 12/09/2013    Procedure: PERMANENT PACEMAKER INSERTION;  Surgeon: Gardiner Rhyme, MD;   Location: [facility] CATH LAB;  Service: Cardiovascular;  Laterality: N/A;   HPI:  HPI: Pt is an 81 y/o female with Osteoporesis, rib fx, leg fx, OA,   dementia, morbitz II s/p pacemaker, CHF here with coughing for last 2  weeks, with clear sputum with some blood mixed with sputum per ED record.  Had a witnessed syncope lasting 1 minute per daughter. Takes pain med for  chronic back pain (took 2 vicodin tabs this morning). This has been  recently increased due to increased pain related to her OA on both hips,  legs. She has been having some balance problems last few days, usually  walks to the bathroom on her own but hasn't been able to do that today.  Then she had the syncopal event. No n/v but had decreased appetite for 1  day. During our interview, patient said she is hungry. Her other complaint  was b/l hip pain going down her legs, has hx of spinal stenosis and takes  pain meds. States using walker at home. No fever/chills, diarrhea. Denies  any cp or sob. CXR revealed diffuse chronic interstitial changes. There is  some suggestion of acute on chronic infiltrate in the right upper lobe.   No Data Recorded  Assessment / Plan / Recommendation CHL IP CLINICAL IMPRESSIONS 02/13/2015  Dysphagia Diagnosis Mild pharyngeal phase dysphagia  Clinical impression Pt presents with mild pharyngeal phase dysphagia. Pt  has a mild delay in swallow initiation with thin, puree and mechanical  soft textures. Swallow initiated at the level of the vallecula. Prolonged  mastication noted when given mechanical soft textures. Trace amount of  residue noted following PO trails with thin liquids. Pt able to remove  residue following dry swallow. Pt did not aspirate or penetrate when asked  to take large consecutive sips of thin liquids or during PO trials with  purees and solid consistencies. Pooling to the level of the pyriform  sinuses noted during large consecutive sips of thin liquids. Pt is  edentulous, therefore, recommend pt initiate Dys 2/ thin liquid diets, pt  is at mild risk of aspiration given respiratory status. SLP will follow  for diet tolerance.      CHL IP TREATMENT  RECOMMENDATION 02/13/2015  Treatment Plan Recommendations Therapy as outlined in treatment plan below      CHL IP DIET RECOMMENDATION 02/13/2015  Diet Recommendations Dysphagia 2 (Fine chop);Thin liquid  Liquid Administration via Straw;Cup  Medication Administration Whole meds with puree  Compensations Small sips/bites;Slow rate  Postural Changes and/or Swallow Maneuvers Seated upright 90 degrees     CHL IP OTHER RECOMMENDATIONS 02/13/2015  Recommended Consults (None)  Oral Care Recommendations Oral care BID  Other Recommendations (None)     CHL IP FOLLOW UP RECOMMENDATIONS 07/19/2013  Follow up Recommendations None     CHL IP FREQUENCY AND DURATION 02/13/2015  Speech Therapy Frequency (ACUTE ONLY) min 2x/week  Treatment Duration 2 weeks     Pertinent Vitals/Pain NA    SLP Swallow Goals No flowsheet data found.  No flowsheet data found.    CHL IP REASON FOR REFERRAL 02/13/2015  Reason for Referral Objectively evaluate swallowing function     CHL IP ORAL PHASE 02/13/2015  Lips (None)  Tongue (None)  Mucous membranes (None)  Nutritional status (None)  Other (None)  Oxygen therapy (None)  Oral Phase WFL  Oral - Pudding Teaspoon (None)  Oral - Pudding Cup (None)  Oral - Honey Teaspoon (None)  Oral - Honey Cup (None)  Oral - Honey Syringe (None)  Oral - Nectar Teaspoon (None)  Oral - Nectar Cup (None)  Oral - Nectar Straw (None)  Oral - Nectar Syringe (None)  Oral - Ice Chips (None)  Oral - Thin Teaspoon (None)  Oral - Thin Cup (None)  Oral - Thin Straw (None)  Oral - Thin Syringe (None)  Oral - Puree (None)  Oral - Mechanical Soft (None)  Oral - Regular (None)  Oral - Multi-consistency (None)  Oral - Pill (None)  Oral Phase - Comment (None)      CHL IP PHARYNGEAL PHASE 02/13/2015  Pharyngeal Phase Impaired  Pharyngeal - Pudding Teaspoon (None)  Penetration/Aspiration details (pudding teaspoon) (None)  Pharyngeal - Pudding Cup (None)  Penetration/Aspiration details (pudding cup) (None)  Pharyngeal - Honey Teaspoon (None)   Penetration/Aspiration details (honey teaspoon) (None)  Pharyngeal - Honey Cup (None)  Penetration/Aspiration details (honey cup) (None)  Pharyngeal - Honey Syringe (None)  Penetration/Aspiration details (honey syringe) (None)  Pharyngeal - Nectar Teaspoon (None)  Penetration/Aspiration details (nectar teaspoon) (None)  Pharyngeal - Nectar Cup (None)  Penetration/Aspiration details (nectar cup) (None)  Pharyngeal - Nectar Straw (None)  Penetration/Aspiration details (nectar straw) (None)  Pharyngeal - Nectar Syringe (None)  Penetration/Aspiration details (nectar syringe) (None)  Pharyngeal - Ice Chips (None)  Penetration/Aspiration details (ice chips) (None)  Pharyngeal - Thin Teaspoon (None)  Penetration/Aspiration details (thin teaspoon) (None)  Pharyngeal - Thin Cup Premature spillage to valleculae;Pharyngeal residue  - valleculae;Pharyngeal residue - pyriform sinuses  Penetration/Aspiration details (thin cup) (None)  Pharyngeal - Thin Straw Premature spillage to valleculae;Pharyngeal  residue - valleculae;Pharyngeal residue - pyriform sinuses  Penetration/Aspiration details (thin straw) (None)  Pharyngeal - Thin Syringe (None)  Penetration/Aspiration details (thin syringe') (None)  Pharyngeal - Puree Delayed swallow initiation  Penetration/Aspiration details (puree) (None)  Pharyngeal - Mechanical Soft Premature spillage to valleculae  Penetration/Aspiration details (mechanical soft) (None)  Pharyngeal - Regular (None)  Penetration/Aspiration details (regular) (None)  Pharyngeal - Multi-consistency (None)  Penetration/Aspiration details (multi-consistency) (None)  Pharyngeal - Pill WFL  Penetration/Aspiration details (pill) (None)  Pharyngeal Comment (None)     CHL IP CERVICAL ESOPHAGEAL PHASE 02/13/2015  Cervical Esophageal Phase WFL  Pudding Teaspoon (None)  Pudding Cup (None)  Honey Teaspoon (None)  Honey Cup (None)  Honey Syringe (None)  Nectar Teaspoon (None)  Nectar Cup (None)  Nectar Straw (None)  Nectar  Syringe (None)  Thin Teaspoon (None)  Thin Cup (None)  Thin Straw (None)  Thin Syringe (None)  Cervical Esophageal Comment (None)    CHL IP GO 02/13/2015  Functional Assessment Tool Used (No Data)  Functional Limitations Swallowing  Swallow Current Status (Z6109) CI  Swallow Goal Status [phone]) CI  Swallow Discharge Status [phone]) (None)  Motor Speech Current Status [phone]) (None)  Motor Speech Goal Status [phone]) (None)  Motor Speech Goal Status [phone]) (None)  Spoken Language Comprehension Current Status [phone]) (None)  Spoken Language Comprehension Goal Status (V7846) (None)  Spoken Language Comprehension Discharge Status [phone]) (None)  Spoken Language Expression Current Status [phone]) (None)  Spoken Language Expression Goal Status [phone]) (None)  Spoken Language Expression Discharge Status [phone]) (None)  Attention Current Status (D6644) (None)  Attention Goal Status (I3474) (None)  Attention Discharge Status [phone]) (None)  Memory Current Status (L8756) (None)  Memory Goal Status (E3329) (None)  Memory Discharge Status (J1884) (None)  Voice Current Status (Z6606) (None)  Voice Goal Status (T0160) (None)  Voice Discharge Status (F0932) (None)  Other Speech-Language Pathology Functional Limitation [phone]) (None)  Other Speech-Language Pathology Functional Limitation Goal Status (U2025)  (None)  Other Speech-Language Pathology Functional Limitation Discharge Status  [phone]) (None)          Harlon Ditty, MA CCC-SLP [phone]  Claudine Mouton 02/13/2015, 1:47 PM    Echo 02/13/15:  Study Conclusions - Left ventricle: The cavity size was normal. Wall thickness was increased in a pattern of mild LVH. Systolic function was vigorous. The estimated ejection fraction was in the range of 65% to 70%. Wall motion was normal; there were no regional wall motion abnormalities. Doppler parameters are consistent with abnormal left ventricular relaxation (grade 1 diastolic dysfunction). - Aortic  valve: There was mild regurgitation. - Mitral valve: Calcified annulus. There was mild regurgitation. - Right atrium: The atrium was mildly dilated. - Atrial septum: No defect or patent foramen ovale was identified. - Tricuspid valve: There was moderate regurgitation. - Pulmonary arteries: PA peak pressure: 65 mm Hg (S).  Admission HPI: History is obtained through patient, and ED provider. Daughter was gone when we were interviewing the patient.  81 yo female with Osteoporesis, rib fx, leg fx, OA, dementia, morbitz II s/p pacemaker, CHF here with coughing for last 2 weeks, with clear sputum with some blood mixed with sputum per ED record. Had a witnessed syncope lasting 1 minute per daughter. Takes pain med for chronic back pain (took 2 vicodin tabs this morning). This has been recently increased due to increased pain related to her OA on both hips, legs. She has been having some balance problems last few days, usually walks to the bathroom on her own but hasn't been able to do that today. Then she had the syncopal event. No n/v but had decreased appetite for 1 day.   During our interview, patient said she is hungry. Her other complaint was b/l hip pain going down her legs, has hx of spinal stenosis and takes pain meds. States using walker at home. No fever/chills, diarrhea. Denies any cp or sob.  Per ED note Hypoxia to 85% on romo air, PO2 on venous gas 15 only. Ed provider concerned about CAP.   [employer] judes pacemaker was interrogated at ED. No events today, had 6 second episode of atrial tachycardia at 160 each per min on 01/09/2015.   Hospital Course by problem list: Principal Problem:   Syncope Active Problems:   Gait disorder   Cough   Alzheimer's disease   Osteoarthritis   Muscle weakness (generalized)   Protein-calorie malnutrition, severe   Chronic diastolic CHF (congestive heart failure)   Sciatic pain   CKD (chronic kidney disease) stage 3, GFR 30-59 ml/min   Pulmonary fibrosis     Ms.  Chumley is an 81 year old woman with diastolic CHF, dementia and a gait imbalance who presented with syncope and shortness of breath. Her CTA chest revealed previously diagnosed pulmonary fibrosis, which may explain her syncopal episode. Patient's daughter works full time and is not comfortable bringing her home with her current functional ability; interested in short-term SNF.  Syncopal Episode: Lasted less than 1 minute. No recurrence. Likely precipitated by her underlying pulmonary fibrosis (first noted in 2009, family not aware of diagnosis). Dementia and increased vicodin (one extra dose on day of episode for sciatic pain) may also have contributed. Her PO intake had also been low the day prior to admission. Pacemaker (placed in 2014 for mobitz II) was interrogated and showed no recent events. Had 6 second episode of atrial tachycardia at 160 each per min on 01/09/2015. Patient was given a small amount of fluid. Echo revealed LVEF 65-70% with grade 1 diastolic dysfunction. After discussion with PCP, clear that patient has not needed supplemental O2 over the past 3 months. Also clear that she has difficult O2 saturation monitoring due to poor circulation. PT recommending SNF.  Initial Elevated Troponin: Cardiology directed repeat echo. Also advised heparin drip and cardiology consult IF patient's troponins elevated to 0.4. Patient's troponins trended down: 0.12-->0.14-->0.10. Patient denied any chest pain. Patient was given home aspirin 325 mg.  Idiopathic Chronic Severe Interstitial Lung Disease: Patient complained of cough productive of mild clear sputum and shortness of breath. May have contributed to syncopal episode. Very difficult to measure her O2 saturations (with her poor circulation); multiple times, her O2 saturation . Venous ABG showed low PO2. Pulmonary fibrosis evident on CXR and CTA chest, first seen on imaging in 2009, formal diagnosis 2014. Follows with Dr. Lajoyce Cornersuda (orthopaedics) for  cortisone injections. May have seronegative RA (MCP deformities on exam, contracture). Afebrile with no white count. [facility]; patient is well-managed without oxygen at home. Has had O2 saturations in the high 90s on all weekly visits over the past 2 months. He believes that this admission had a component of over-reaction by the patient's very anxious daughter; he also believes that further workup of ILD is not necessary, as it would not change plans. Repeat CXR on day of discharge showed the advanced changes of pulmonary fibrosis with cardiac enlargement, but no signs of consolidation.  CKD Stage III: At baseline (1.22->1.15) after cautious fluid resuscitation. Dry on initial exam. Normal UA. On lasix 20 mg by mouth daily at home.  Chronic Diastolic CHF: Prior echo 06/2013, 65-70% EF moderate concentric hypertrophy, mild pulm HTN. Repeat echo showed LVEF 65-70% with grade 1 diastolic dysfunction. Held 2 doses of home lasix, then resumed. Continued home aspirin 325 mg daily.  Mobitz Type II Second Degree AV Block: Has pacemaker (interrogated in ED to show no recent events. Had 6 second episode of atrial tachycardia at 160 each per min on 01/09/2015).  Dementia: Continued home aricept + celexa.  Spinal Stenosis and Sciatic Pain: Recently increased vicodin dose is 5-325 mg by mouth TID. This may be contributing to syncope, worsening mental status; may also be causing respiratory inhibition. Held vicodin in hospital, no patient complaints. Lidoderm patch and Neurontin kept at daily home doses. Per PCP, patient should not be taking extra doses of vicodin at discharge.  Severe Protein Malnutrition: Has temporal wasting on exam, albumin 3.0, takes Boost Breeze.  Osteoporosis: Had leg fracture 11/2014. Continuing home calcium.   Discharge Vitals:   BP 124/75 mmHg  Pulse  80  Temp(Src) 98.3 F (36.8 C) (Oral)  Resp 18  Ht 4' (1.219 m)  Wt 85 lb 5.1 oz (38.7 kg)  BMI 26.04 kg/m2   SpO2 90%  Signed: Dionne Ano, MD 02/14/2015, 2:47 PM    Services Ordered on Discharge: SNF [facility]) Equipment Ordered on Discharge: none

## 2015-02-14 NOTE — Progress Notes (Signed)
EMS arrived to unit to transfer patient to [facility]. Patient assessed showing signs of trouble breathing. MD notified. Respirations 20. BP and HR unable to obtain.

## 2015-02-14 NOTE — Procedures (Signed)
Intubation Procedure Note Christianne BorrowLucille Dobek [id number] 1928-02-07  Procedure: Intubation Indications: Respiratory insufficiency  Procedure Details Consent: Unable to obtain consent because of emergent medical necessity. Time Out: Verified patient identification, verified procedure, site/side was marked, verified correct patient position, special equipment/implants available, medications/allergies/relevent history reviewed, required imaging and test results available.  Performed  Maximum sterile technique was used including gloves and hand hygiene.  MAC and 3    Evaluation Hemodynamic Status: Transient hypotension treated with fluid; O2 sats: currently acceptable Patient's Current Condition: unstable Complications: No apparent complications Patient did tolerate procedure well. Chest X-ray ordered to verify placement.  CXR: pending.   Performed during cardiac arrest.  Coralyn HellingVineet Duey Liller, MD [facility]/Critical Care 02/14/2015, 6:50 PM Pager:  [phone] After 3pm call: [phone]

## 2015-02-14 NOTE — Progress Notes (Signed)
  CODE BLUE NOTE  81 y/o with dementia, end-stage lung disease and chronic diastolic HF.  Was being discharged to SNF today and became unresponsive with agonal respirations. I was on floor and was 1st physician responder.   Note in chart said limited code CPR and pressors ok but no intubation. Bedside nurse called daughter who requested full code.  Agonal respirations. No palpable pulses. CPR personally initiated immediately. Initial rhythm on monitor was junctional with v-pacing. Given narcan without response. Received bag ventilation then intubated by Dr. Craige CottaSood. Treated with 1 amp epinephrine and had return of spontaneous circulation. Total CPR time 5-10 mins.   Rhythm on monitor appeared to be junctional tach.   She will be transported to ICU on CCM service.  The patient is critically ill with multiple organ systems failure and requires high complexity decision making for assessment and support, frequent evaluation and titration of therapies, application of advanced monitoring technologies and extensive interpretation of multiple databases.   Critical Care Time devoted to patient care services described in this note is 35 Minutes.  Truman HaywardDaniel Tammye Kahler,MD 6:24 PM

## 2015-02-14 NOTE — Discharge Instructions (Signed)
Please do not take any extra vicodin (only amount prescribed).   Continue to monitor your oxygen needs by your work of breathing, rather than by your O2 saturation (your poor circulation makes the finger O2 saturation monitor very difficult to interpret). For most of this hospitalization, you were saturating well on room air. Doubt very much the necessity of supplemental oxygen once you leave [facility].  There was no evidence of pneumonia or infection on either of your chest xrays.

## 2015-02-14 NOTE — Code Documentation (Signed)
81 yo female admitted with syncope was in the process of being transferred to nursing home.  Was noted to be unresponsive.  She was not breathing and did not have a pulse.  CPR was started.  She was given epi x 2 and atropine.  She was intubated.  She had ROSC after about 10 minutes, and was transferred to ICU.  Coralyn HellingVineet Gerald Kuehl, MD [facility] Pulmonary/Critical Care 02/14/2015, 6:53 PM Pager:  [phone] After 3pm call: [phone]

## 2015-02-15 ENCOUNTER — Inpatient Hospital Stay (HOSPITAL_COMMUNITY): Payer: Medicare (Managed Care)

## 2015-02-15 DIAGNOSIS — J8410 Pulmonary fibrosis, unspecified: Secondary | ICD-10-CM

## 2015-02-15 DIAGNOSIS — I5032 Chronic diastolic (congestive) heart failure: Secondary | ICD-10-CM

## 2015-02-15 LAB — GLUCOSE, CAPILLARY
GLUCOSE-CAPILLARY: 125 mg/dL — AB (ref 70–99)
GLUCOSE-CAPILLARY: 114 mg/dL — AB (ref 70–99)
GLUCOSE-CAPILLARY: 91 mg/dL (ref 70–99)
Glucose-Capillary: 100 mg/dL — ABNORMAL HIGH (ref 70–99)
Glucose-Capillary: 104 mg/dL — ABNORMAL HIGH (ref 70–99)
Glucose-Capillary: 160 mg/dL — ABNORMAL HIGH (ref 70–99)

## 2015-02-15 LAB — BASIC METABOLIC PANEL
ANION GAP: 7 (ref 5–15)
BUN: 24 mg/dL — AB (ref 6–23)
CALCIUM: 7.6 mg/dL — AB (ref 8.4–10.5)
CHLORIDE: 119 mmol/L — AB (ref 96–112)
CO2: 20 mmol/L (ref 19–32)
Creatinine, Ser: 1.31 mg/dL — ABNORMAL HIGH (ref 0.50–1.10)
GFR, EST AFRICAN AMERICAN: 42 mL/min — AB (ref >90)
GFR, EST NON AFRICAN AMERICAN: 36 mL/min — AB (ref >90)
Glucose, Bld: 133 mg/dL — ABNORMAL HIGH (ref 70–99)
Potassium: 3.5 mmol/L (ref 3.5–5.1)
Sodium: 146 mmol/L — ABNORMAL HIGH (ref 135–145)

## 2015-02-15 LAB — CBC
HCT: 29.3 % — ABNORMAL LOW (ref 36.0–46.0)
HEMOGLOBIN: 9.6 g/dL — AB (ref 12.0–15.0)
MCH: 28.3 pg (ref 26.0–34.0)
MCHC: 32.8 g/dL (ref 30.0–36.0)
MCV: 86.4 fL (ref 78.0–100.0)
Platelets: 192 10*3/uL (ref 150–400)
RBC: 3.39 MIL/uL — ABNORMAL LOW (ref 3.87–5.11)
RDW: 14.2 % (ref 11.5–15.5)
WBC: 18.2 10*3/uL — ABNORMAL HIGH (ref 4.0–10.5)

## 2015-02-15 MED ORDER — FENTANYL CITRATE 0.05 MG/ML IJ SOLN
25.0000 ug | INTRAMUSCULAR | Status: DC | PRN
  Administered 2015-02-15 (×2): 50 ug via INTRAVENOUS
  Administered 2015-02-15 – 2015-02-16 (×3): 100 ug via INTRAVENOUS
  Filled 2015-02-15 (×5): qty 2

## 2015-02-15 MED FILL — Medication: Qty: 1 | Status: AC

## 2015-02-15 NOTE — Progress Notes (Signed)
RN calling about vent dysnchrony  Plan resrart diprivan If persists, consider fent gtt  Dr. Kalman ShanMurali Denece Shearer, M.D., [facility] Pulmonary and Critical Care Medicine Staff Physician [facility] Pulmonary and Critical Care Pager: [phone], If no answer or between  15:00h - 7:00h: call [phone]  02/15/2015 8:54 PM

## 2015-02-15 NOTE — Progress Notes (Signed)
Pt has 80ml urine output for the shift so far. Dr. Gayla DossJoyner made aware.

## 2015-02-15 NOTE — Progress Notes (Signed)
UR Completed.  [phone]  

## 2015-02-15 NOTE — Progress Notes (Signed)
PULMONARY / CRITICAL CARE MEDICINE   Name: Mary Hartman MRN: [id number] DOB: [birth year]    ADMISSION DATE:  02/14/2015 CONSULTATION DATE:  02/14/2015  REFERRING MD :  Dr. Dalphine Handing  CHIEF COMPLAINT:  Arrest  INITIAL PRESENTATION: 81 year old female admitted 2/15 with syncope, SOB, and NSTEMI. New diagnosis of IPF These symptoms had improved and she was preparing for discharge 2/17 when she suffered a cardiac arrest. Approximately 10 minutes PEA downtime before ROSC. To ICU.  STUDIES:  2/15 CTA chest > Negative for PE, pulmonary fibrosis, increased RUL opacification.   SIGNIFICANT EVENTS: 2/15 Admitted 2/17 Cardiac arrest   HISTORY OF PRESENT ILLNESS:  81 year old female with PMH as below, which includes diastolic CHF, Dementia, end stage pulmonary fibrosis, and AV block mobitz type II. She presented to [facility] ED 2/15 c/o cough x 2 weeks and syncopal episode x 1 on day of admission. Of note her hydrocodone dose had recently been increased. He was hypoxic in ED as well. She was admitted to IMTS with syncope, SOB (thought to be 2nd to IPF), Type 2 NSTEMI, and CKD. She continued to improve until 2/17 when she was preparing for discharge to [place]. She was noted to be unresponsive with agonal respirations. She then suffered a cardiac arrest and under went about 10-15 minutes of ACLS until ROSC was achieved. She was promptly transferred to ICU on ventilator.    SUBJECTIVE: unresponsive Sedated on propofol gtt + fent gtt due to resp discomfort Poor UO  VITAL SIGNS: Temp:  [97.5 F (36.4 C)-98.5 F (36.9 C)] 97.5 F (36.4 C) (02/18 0850) Pulse Rate:  [59-113] 59 (02/18 1100) Resp:  [17-38] 17 (02/18 1100) BP: (75-137)/(45-99) 111/64 mmHg (02/18 1100) SpO2:  [88 %-100 %] 99 % (02/18 1100) FiO2 (%):  [50 %-100 %] 70 % (02/18 1000) Weight:  [40 kg (88 lb 2.9 oz)] 40 kg (88 lb 2.9 oz) (02/17 2000) HEMODYNAMICS:   VENTILATOR SETTINGS: Vent Mode:  [-] PRVC FiO2 (%):  [50 %-100 %] 70  % Set Rate:  [16 bmp-26 bmp] 26 bmp Vt Set:  [370 mL] 370 mL PEEP:  [5 cmH20] 5 cmH20 Plateau Pressure:  [16 cmH20] 16 cmH20 INTAKE / OUTPUT:  Intake/Output Summary (Last 24 hours) at 02/15/15 1203 Last data filed at 02/15/15 1200  Gross per 24 hour  Intake 1781.39 ml  Output    194 ml  Net 1587.39 ml    PHYSICAL EXAMINATION: General:  Cachectic elderly female on vent Neuro:  Comatose, no response to verbal, withdraws to DPS HEENT:  [place]/AT, Mild JVD Cardiovascular:  Tachycardic, regular Lungs:  Diffuse rhonchi, no wheeze Abdomen:  Soft, distended Musculoskeletal:  No acute deformity Skin:  Grossly intact  LABS:  CBC  Recent Labs Lab 02/01/2015 1641 02/13/15 0550 02/15/15 0327  WBC 6.3 6.6 18.2*  HGB 14.5 10.3* 9.6*  HCT 43.0 31.9* 29.3*  PLT 158 183 192   Coag's No results for input(s): APTT, INR in the last 168 hours. BMET  Recent Labs Lab 02/09/2015 1641 02/13/15 0411 02/15/15 0327  NA 143 142 146*  K 3.9 3.9 3.5  CL 111 113* 119*  CO2 20 17* 20  BUN 18 18 24*  CREATININE 1.22* 1.15* 1.31*  GLUCOSE 167* 108* 133*   Electrolytes  Recent Labs Lab 02/11/2015 1641 02/13/15 0411 02/15/15 0327  CALCIUM 8.7 7.9* 7.6*   Sepsis Markers No results for input(s): LATICACIDVEN, PROCALCITON, O2SATVEN in the last 168 hours. ABG  Recent Labs Lab 02/14/15 2036  PHART 7.354  PCO2ART 27.9*  PO2ART 234.0*   Liver Enzymes  Recent Labs Lab 04-30-2015 1641  AST 39*  ALT 37*  ALKPHOS 79  BILITOT 0.7  ALBUMIN 3.0*   Cardiac Enzymes  Recent Labs Lab 04-30-2015 2158 02/13/15 0411 02/13/15 1000  TROPONINI 0.12* 0.14* 0.10*   Glucose  Recent Labs Lab 02/14/15 1759 02/14/15 1911 02/15/15 0022 02/15/15 0356 02/15/15 0817  GLUCAP 322* 197* 160* 125* 114*    Imaging Dg Chest 2 View  02/14/2015   CLINICAL DATA:  Shortness of breath for 6 months  EXAM: CHEST  2 VIEW  COMPARISON:  09/09/2015  FINDINGS: There is a left chest wall pacer device with leads  in the right atrial appendage and right ventricle. The heart size appears mildly enlarged. Diffuse coarsened interstitial markings are identified bilaterally compatible with pulmonary fibrosis. No superimposed airspace consolidation identified. No plural effusion identified.  IMPRESSION: 1. Advanced changes of pulmonary fibrosis. 2. Cardiac enlargement.   Electronically Signed   By: Signa Kellaylor  Stroud M.D.   On: 02/14/2015 14:27   Portable Chest Xray  02/14/2015   CLINICAL DATA:  Intubated.  EXAM: PORTABLE CHEST - 1 VIEW  COMPARISON:  Earlier today.  FINDINGS: Interval endotracheal tube with its tip 2.4 cm above the carina. Stable enlarged cardiac silhouette and diffusely prominent interstitial markings. Stable left subclavian pacer leads. Diffuse osteopenia.  IMPRESSION: 1. The endotracheal tube is somewhat low in position. This could be retracted 4 cm. 2. Stable cardiomegaly and chronic interstitial lung disease.   Electronically Signed   By: Beckie SaltsSteven  Reid M.D.   On: 02/14/2015 19:15   Dg Abd Portable 1v  02/14/2015   CLINICAL DATA:  OG tube placement.  EXAM: PORTABLE ABDOMEN - 1 VIEW  COMPARISON:  Radiographs dated 02/07/2012  FINDINGS: The OG tube is coiled in the fundus of the stomach.  Bowel gas pattern is normal. There is a severe lumbar scoliosis. No acute osseous abnormality.  IMPRESSION: OG tube coiled in the fundus of the stomach.   Electronically Signed   By: Francene BoyersJames  Maxwell M.D.   On: 02/14/2015 20:46     ASSESSMENT / PLAN:  PULMONARY OETT 2/17 > A: Acute hypoxic respiratory failure s/p arrest. Recently dx pulmonary fibrosis >> CT appearance looks like this has been present for some time. P:   Full vent support VAP bundle  CARDIOVASCULAR A:  Cardiac arrest - pos trops post arrest Chronic diastolic CHF (LVEF 65-70%), severe pulm htn RVSP 65 H/o Mobitz II block. P:  MAP goal > 4565mm/Hg Gentle IVF resusctitiation No additional CPR No escalation  RENAL A:   CKD stage 2. P:    Follow Bmet, UO  GASTROINTESTINAL A:   Severe protein calorie malnutrition. Dysphagia. P:   NPO SUP: IV protonix  HEMATOLOGIC A:   Anemia of chronic disease. P:  Follow   INFECTIOUS A:   No acute issues. P:   Trend WBC and fever curve  ENDOCRINE A:   No acute issues. P:   CBG monitoring  NEUROLOGIC A:   Acute encephalopathy s/p cardiac arrest. Hx of dementia >> family reports she was independent with most ADLs prior to admission. Chronic pain. P:   RASS goal: 0 Monitor PRN fent & propofol gtt if sedation needed Hold home sedating meds  Family  -Updated daughter (POA).  She would like to continue current tx including mechanical ventilation x 24 more h .  She would not want CPR again if she develops cardiac arrest, and would not  want escalation of care if her medical status gets worse.  The patient is critically ill with multiple organ systems failure and requires high complexity decision making for assessment and support, frequent evaluation and titration of therapies, application of advanced monitoring technologies and extensive interpretation of multiple databases. Critical Care Time devoted to patient care services described in this note independent of APP time is 35 minutes.    Oretha Milch MD 02/15/2015, 12:03 PM

## 2015-02-15 NOTE — Progress Notes (Signed)
Chaplain responded to Code Blue for pt in 3E30. No family present during code. Pt was revived and then moved to 77M ICU.

## 2015-02-15 NOTE — Clinical Social Work Placement (Addendum)
    Clinical Social Work Department CLINICAL SOCIAL WORK PLACEMENT NOTE 02/15/2015  Patient:  Mary Hartman,Mary Hartman  Account Number:  [id number] Admit date:  02/25/2015  Clinical Social Worker:  Nelvin Tomb, LCSW  Date/time:  02/14/2015 12:00 N  Clinical Social Work is seeking post-discharge placement for this patient at the following level of care:   SKILLED NURSING   (*CSW will update this form in Epic as items are completed)   02/14/2015  Patient/family provided with [facility] Department of Clinical Social Work's list of facilities offering this level of care within the geographic area requested by the patient (or if unable, by the patient's family).    Patient/family informed of their freedom to choose among providers that offer the needed level of care, that participate in Medicare, Medicaid or managed care program needed by the patient, have an available bed and are willing to accept the patient.    Patient/family informed of [facility]' ownership interest in [facility], as well as of the fact that they are under no obligation to receive care at this facility.  PASARR submitted to EDS on 02/14/2015 PASARR number received on 02/14/2015  FL2 transmitted to all facilities in geographic area requested by pt/family on  02/14/2015 FL2 transmitted to all facilities within larger geographic area on   Patient informed that his/her managed care company has contracts with or will negotiate with  certain facilities, including the following:   Patient is a PACE patient.  Referral only [facility] to [facility] and [facility] which are [facility] contacted facilities.     Patient/family informed of bed offers received:  02/14/2015 [facility] Physician recommends and patient chooses bed at    Patient to be transferred to  on   Patient to be transferred to facility by Ambulance Patient and family notified of transfer on  Name of family member  notified:    The following physician request were entered in Epic: Physician Request  Please prepare priority discharge summary and prescriptions.  Please sign FL2.    Additional Comments: 02/14/15  [facility]. Patient is pleasant confused but agreeable to d/c. Notified patient's daughter Dorena DewDorothy Lanier who was pleased with d/c plan.  EMS arranged. Nursing notified to call report. Lorri Frederickonna T. Andria RheinCrowder, LCSW [phone]  02/15/15 Patient's d/c delayed; prior to d/c yesterday evening- patient was found to be unresponsive and Rapid Response was notified.  Patient has been moved to 81M and is on a vent.  Will provide handoff to [facility] Nixon, LCSWA. Lorri Frederickonna T. Jilliam Bellmore, LCSW, [phone]

## 2015-02-15 NOTE — Progress Notes (Signed)
Bedside EEG completed, results pending. 

## 2015-02-15 NOTE — Progress Notes (Signed)
02/15/15 Patient's d/c delayed; prior to d/c yesterday evening- patient was found to be unresponsive and Rapid Response was notified. Patient has been moved to 17M and is on a vent. Will provide handoff to [facility], LCSWA. Lorri Frederickonna T. Dyllan Kats, LCSW, [phone]

## 2015-02-15 NOTE — Procedures (Signed)
EEG report.  Brief clinical history: 81 year old female admitted 2/15 with syncope, SOB, and NSTEMI. New diagnosis of IPF These symptoms had improved and she was preparing for discharge 2/17 when she suffered a cardiac arrest. Approximately 10 minutes PEA downtime before ROSC. Patient is on propofol,  intubated on the vent.   Technique: this is a 17 channel routine scalp EEG performed at the bedside in the ICU setting, with bipolar and monopolar montages arranged in accordance to the international 10/20 system of electrode placement. One channel was dedicated to EKG recording.  The study was performed while patient is on propofol. No activating procedures performed.   Description: the study demonstrated markedly generalized background suppression while recording at 7 microvolts, but increasing the sensitivity over 2 microvolts results in faster and higher amplitude frequencies with intermixed theta and occasional delta activity over the bifrontal regions. EKG showed sinus rhythm.   Impression: this is an abnormal EEG because of the above described findings, and it is consistent with severe bi cerebral dysfunction that in this particular scenario is most likely the result of severe post anoxic encephalopathy. Clinical correlation is advised.   Wyatt Portelasvaldo Camilo, MD

## 2015-02-15 NOTE — Progress Notes (Signed)
CSW spoke with Butler DenmarkJanet Pannell, PACE Social Worker and updated her on patient's current bed status.  Also [facility] [place] [facility]. She is aware of patient's condition and will continue to monitor for possible admission when medically stable. Patient is currently on a vent and is in "poor condition" per MD note. CSW will continue to monitor and provide support as indicated.  Derenda FennelBashira Nixon, LCSWA is aware of above.  Lorri Frederickonna T. Jaci LazierCrowder, KentuckyLCSW [phone]

## 2015-02-15 NOTE — Progress Notes (Signed)
eLink Physician-Brief Progress Note Patient Name: Mary BorrowLucille Hartman DOB: [birth year] MRN: [id number]   Date of Service  02/15/2015  HPI/Events of Note  Severe airtrapping on vent flow curve Appears sedated but making respiratory effort, so do not think this is autotriggering Chart reviewed  eICU Interventions  Add fentanyl infusion for comfort, slow RR     Intervention Category Major Interventions: Respiratory failure - evaluation and management  MCQUAID, DOUGLAS 02/15/2015, 12:00 AM

## 2015-02-16 LAB — GLUCOSE, CAPILLARY
GLUCOSE-CAPILLARY: 97 mg/dL (ref 70–99)
Glucose-Capillary: 96 mg/dL (ref 70–99)

## 2015-02-27 NOTE — Progress Notes (Addendum)
fentynyl 175cc wasted from 250cc bag. witnessed by Elena Cothern, rn and mavis nyako rn  

## 2015-02-27 NOTE — Progress Notes (Signed)
Patient had loss of pulse at approximately 0715. Patient is partial code with no cpr or drugs.  Dr Gayla DossJoyner at bedside and death pronounced.  Family called and aware of patient death.

## 2015-02-27 DEATH — deceased

## 2015-04-22 IMAGING — CR DG CHEST 2V
2 series · 2 of 2 positions shown · non-contrast
Comparison: 03/20/2014

CLINICAL DATA: Cough and wheezing for 2 days

EXAM:
CHEST  2 VIEW

[chest lat]
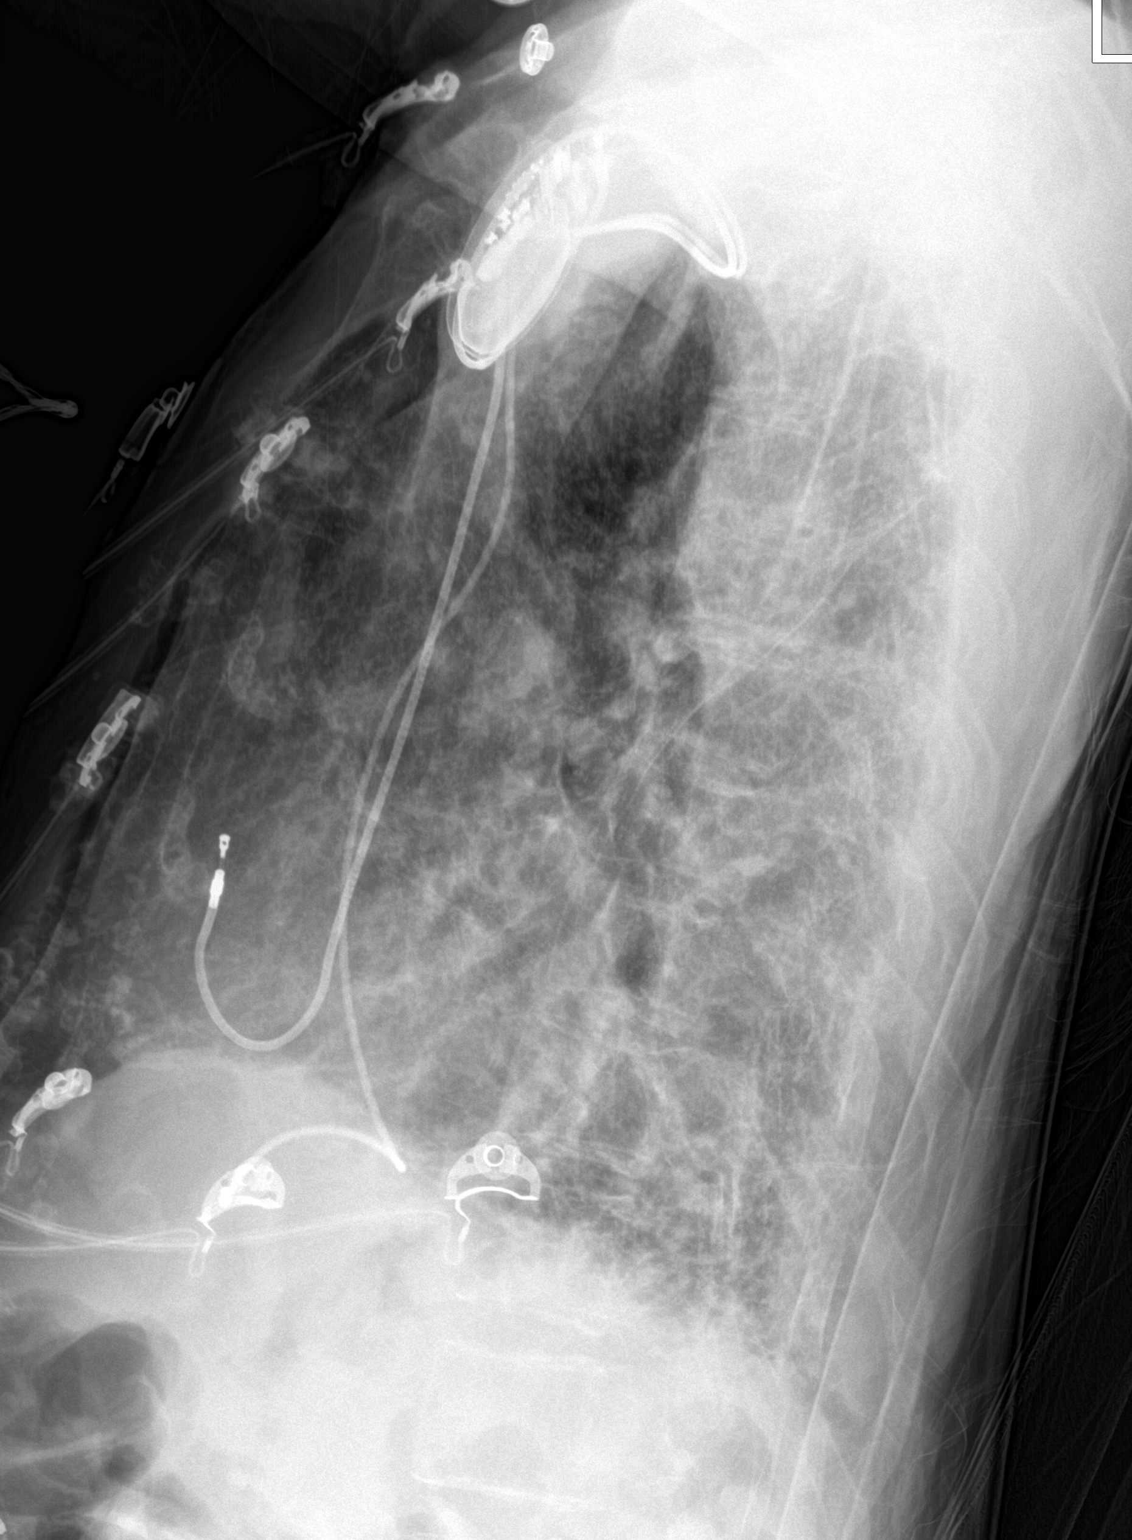

[chest ap]
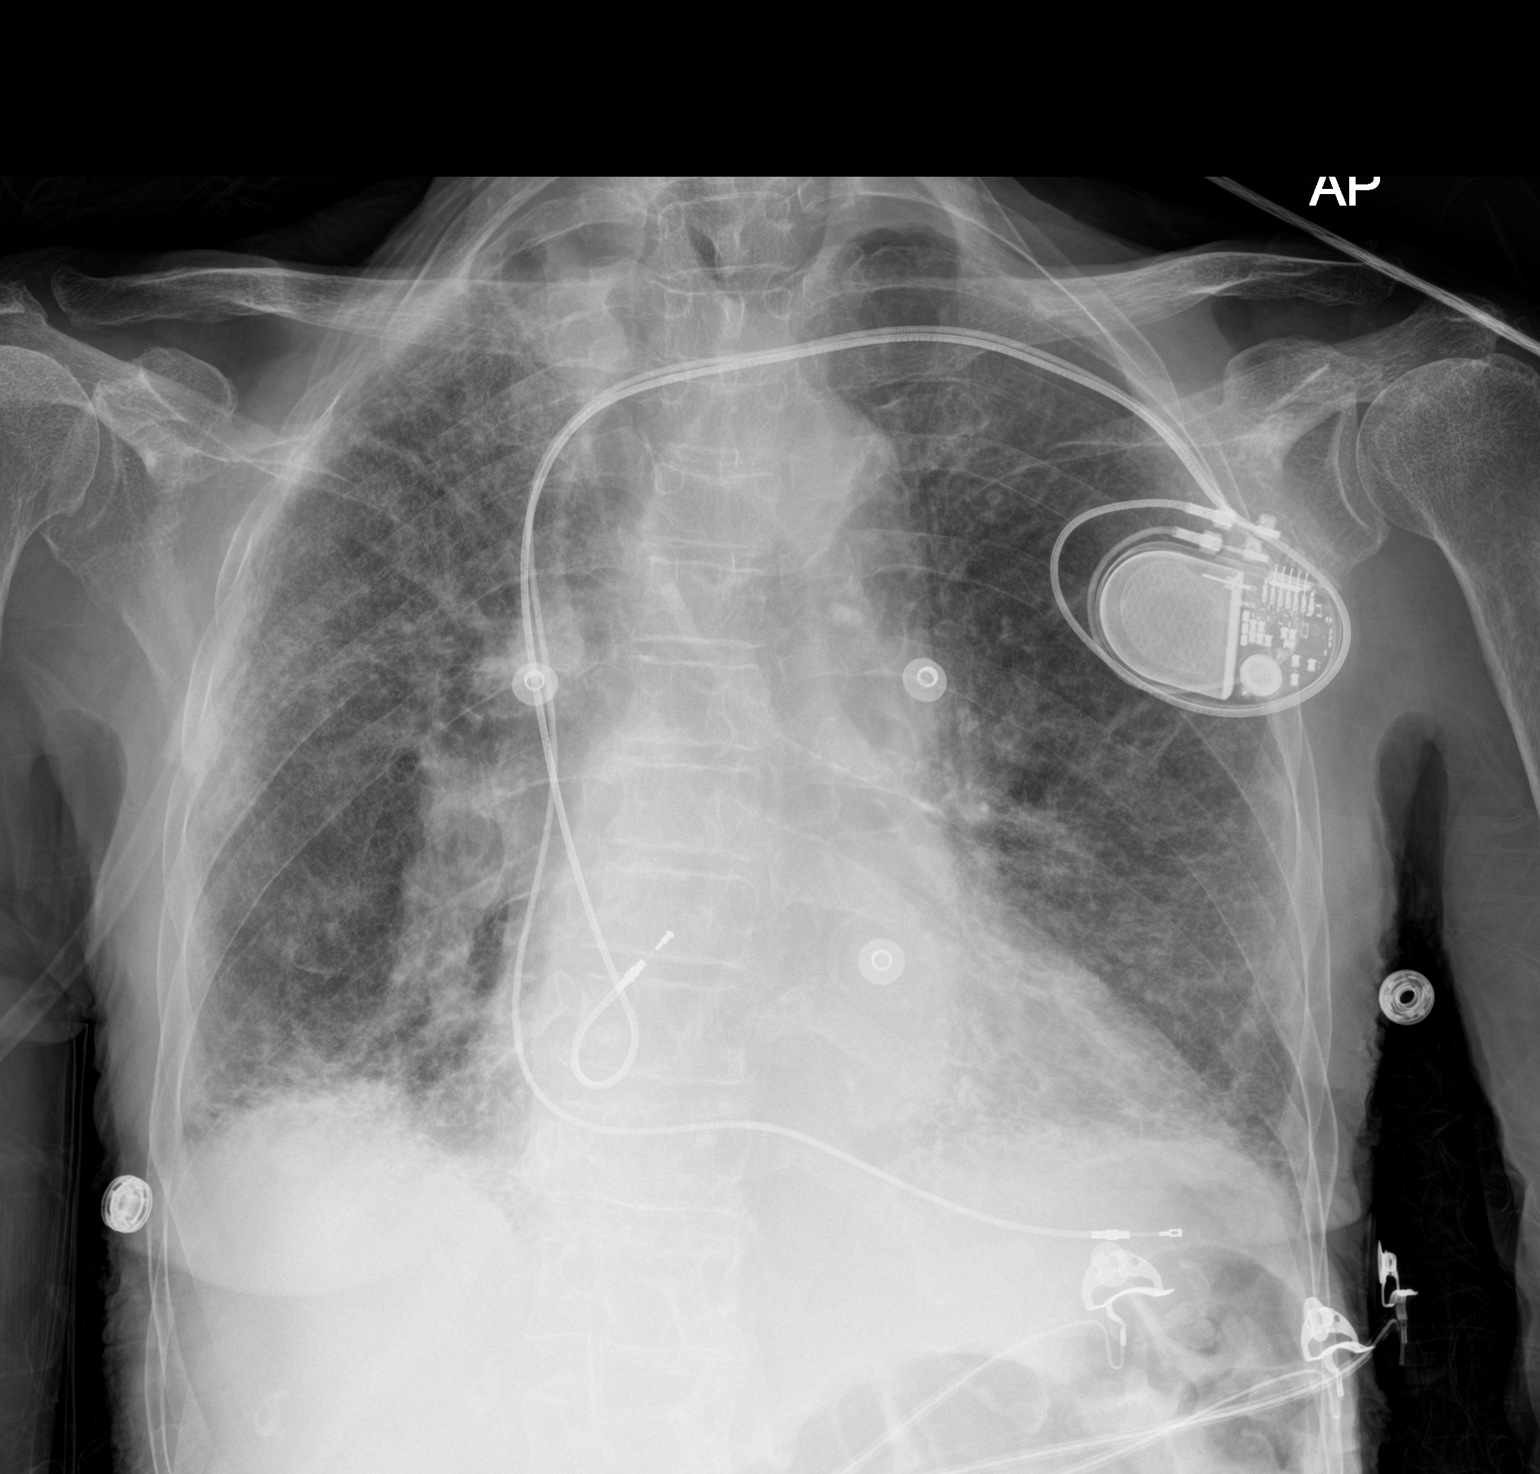

[2 of 2 positions shown; findings below may reference images not displayed]

FINDINGS: Cardiac shadow is mildly prominent. A pacing device is again seen
and stable. Diffuse chronic interstitial changes are noted
throughout both lungs. Some slight increased density in the right
upper lobe may represent progression of the chronic changes or
possibly acute on chronic infiltrate. No sizable effusion is seen.
IMPRESSION: Diffuse chronic interstitial changes. There is some suggestion of
acute on chronic infiltrate in the right upper lobe.

## 2015-05-10 ENCOUNTER — Telehealth: Payer: Self-pay

## 2015-05-10 NOTE — Telephone Encounter (Signed)
Received call from Dennis BastJoyce Johnson from [facility] on May 11 regarding death certificate which was signed in February by Dr. Vassie LollAlva.  Due to insurance company issues they were requesting Dr. Vassie LollAlva do an amendment to the death certificate.  [employer] printed an amendment form and filled out as much as she could.  Patient's daughter also called and spoke to me regarding what the insurance company was requiring. I delivered to Dr. Reginia NaasAlva's nurse and relayed to her what I knew.  She presented it to Dr. Vassie LollAlva who signed the amendment form.  I called Ms. Johnson on May 12,2016 and she picked up form.  Copy of amendment was also made.

## 2015-05-11 ENCOUNTER — Telehealth: Payer: Self-pay

## 2015-05-11 NOTE — Telephone Encounter (Signed)
Received another call from Dennis BastJoyce Johnson, [facility], May 12.  Form that was filled out by Dr. Vassie LollAlva for amendment to death certificate was wrong form.  She will deliver correct form to me today.  Paged Dr. Vassie LollAlva who is at hospital today and am awaiting call back to let him know I will be sending correct form to him via courier.  No response from Dr. Vassie LollAlva.  Will send to E-link as he will not be back at hospital until Sunday.  Received back 5/16 and called [facility]. Johnson [facility] for pick-up.
